# Patient Record
Sex: Male | Born: 1935 | Race: White | Hispanic: No | Marital: Married | Smoking: Never smoker
Health system: Southern US, Community
[De-identification: ages and names within clinical notes are randomized; demographics above are authoritative.]

## PROBLEM LIST (undated history)

## (undated) DIAGNOSIS — N200 Calculus of kidney: Secondary | ICD-10-CM

## (undated) DIAGNOSIS — C61 Malignant neoplasm of prostate: Secondary | ICD-10-CM

---

## 2005-09-28 ENCOUNTER — Ambulatory Visit: Payer: Self-pay | Admitting: Internal Medicine

## 2005-10-24 ENCOUNTER — Ambulatory Visit: Payer: Self-pay | Admitting: Internal Medicine

## 2005-11-01 ENCOUNTER — Ambulatory Visit: Payer: Self-pay | Admitting: Internal Medicine

## 2005-11-18 ENCOUNTER — Ambulatory Visit: Payer: Self-pay | Admitting: Internal Medicine

## 2006-02-21 ENCOUNTER — Ambulatory Visit: Payer: Self-pay | Admitting: Internal Medicine

## 2006-08-24 ENCOUNTER — Ambulatory Visit: Payer: Self-pay | Admitting: Internal Medicine

## 2007-01-13 ENCOUNTER — Other Ambulatory Visit: Payer: Self-pay

## 2007-01-13 ENCOUNTER — Emergency Department: Payer: Self-pay | Admitting: Emergency Medicine

## 2007-08-22 ENCOUNTER — Ambulatory Visit: Payer: Self-pay | Admitting: Gastroenterology

## 2009-04-07 ENCOUNTER — Ambulatory Visit: Payer: Self-pay | Admitting: Urology

## 2011-04-04 ENCOUNTER — Ambulatory Visit: Payer: Self-pay | Admitting: Internal Medicine

## 2011-04-06 ENCOUNTER — Ambulatory Visit: Payer: Self-pay | Admitting: Urology

## 2011-04-07 ENCOUNTER — Ambulatory Visit: Payer: Self-pay | Admitting: Urology

## 2011-06-07 ENCOUNTER — Ambulatory Visit: Payer: Self-pay | Admitting: Oncology

## 2012-12-26 ENCOUNTER — Ambulatory Visit: Payer: Self-pay | Admitting: Radiation Oncology

## 2013-01-06 ENCOUNTER — Ambulatory Visit: Payer: Self-pay | Admitting: Radiation Oncology

## 2013-01-24 LAB — CBC CANCER CENTER
Basophil #: 0 x10 3/mm (ref 0.0–0.1)
Basophil %: 0.7 %
Eosinophil #: 0.2 x10 3/mm (ref 0.0–0.7)
Eosinophil %: 3.8 %
HCT: 40 % (ref 40.0–52.0)
HGB: 13.7 g/dL (ref 13.0–18.0)
Lymphocyte #: 1.1 x10 3/mm (ref 1.0–3.6)
Lymphocyte %: 19.7 %
MCV: 90 fL (ref 80–100)
Monocyte #: 0.5 x10 3/mm (ref 0.2–1.0)
Monocyte %: 9.7 %
Neutrophil #: 3.6 x10 3/mm (ref 1.4–6.5)
Neutrophil %: 66.1 %
Platelet: 180 x10 3/mm (ref 150–440)
RDW: 13.5 % (ref 11.5–14.5)

## 2013-01-31 LAB — CBC CANCER CENTER
Basophil #: 0 x10 3/mm (ref 0.0–0.1)
Eosinophil #: 0.3 x10 3/mm (ref 0.0–0.7)
Eosinophil %: 5.9 %
HCT: 41.5 % (ref 40.0–52.0)
Lymphocyte #: 1 x10 3/mm (ref 1.0–3.6)
Lymphocyte %: 20.6 %
MCH: 30.9 pg (ref 26.0–34.0)
MCHC: 34.4 g/dL (ref 32.0–36.0)
Monocyte %: 10.7 %
Neutrophil #: 3.1 x10 3/mm (ref 1.4–6.5)
Platelet: 184 x10 3/mm (ref 150–440)
RBC: 4.63 10*6/uL (ref 4.40–5.90)

## 2013-02-05 ENCOUNTER — Ambulatory Visit: Payer: Self-pay | Admitting: Radiation Oncology

## 2013-02-07 LAB — CBC CANCER CENTER
Basophil #: 0 x10 3/mm (ref 0.0–0.1)
Eosinophil #: 0.3 x10 3/mm (ref 0.0–0.7)
Eosinophil %: 6.3 %
HCT: 40.2 % (ref 40.0–52.0)
Lymphocyte #: 0.9 x10 3/mm — ABNORMAL LOW (ref 1.0–3.6)
Lymphocyte %: 18.9 %
MCH: 30.9 pg (ref 26.0–34.0)
MCV: 90 fL (ref 80–100)
Monocyte %: 8.3 %
Neutrophil #: 3.1 x10 3/mm (ref 1.4–6.5)
Neutrophil %: 65.6 %
Platelet: 157 x10 3/mm (ref 150–440)
RBC: 4.44 10*6/uL (ref 4.40–5.90)
RDW: 13.4 % (ref 11.5–14.5)
WBC: 4.7 x10 3/mm (ref 3.8–10.6)

## 2013-02-14 LAB — CBC CANCER CENTER
Basophil #: 0 x10 3/mm (ref 0.0–0.1)
Basophil %: 0.8 %
Eosinophil #: 0.3 x10 3/mm (ref 0.0–0.7)
HGB: 13.4 g/dL (ref 13.0–18.0)
Lymphocyte #: 0.8 x10 3/mm — ABNORMAL LOW (ref 1.0–3.6)
Monocyte #: 0.4 x10 3/mm (ref 0.2–1.0)
Monocyte %: 9.1 %
Platelet: 166 x10 3/mm (ref 150–440)
RDW: 13.9 % (ref 11.5–14.5)

## 2013-02-21 LAB — CBC CANCER CENTER
Basophil #: 0 x10 3/mm (ref 0.0–0.1)
Basophil %: 0.9 %
HGB: 13.5 g/dL (ref 13.0–18.0)
Lymphocyte #: 0.8 x10 3/mm — ABNORMAL LOW (ref 1.0–3.6)
Lymphocyte %: 16.5 %
MCH: 30.3 pg (ref 26.0–34.0)
MCV: 90 fL (ref 80–100)
Monocyte %: 12 %
Neutrophil %: 65.2 %
RBC: 4.47 10*6/uL (ref 4.40–5.90)
RDW: 13.8 % (ref 11.5–14.5)
WBC: 5.1 x10 3/mm (ref 3.8–10.6)

## 2013-02-28 LAB — CBC CANCER CENTER
Basophil %: 0.7 %
Eosinophil #: 0.2 x10 3/mm (ref 0.0–0.7)
Eosinophil %: 5.6 %
HGB: 14.1 g/dL (ref 13.0–18.0)
Lymphocyte %: 18 %
MCH: 30.7 pg (ref 26.0–34.0)
Monocyte #: 0.4 x10 3/mm (ref 0.2–1.0)
Monocyte %: 8.3 %
Neutrophil #: 2.9 x10 3/mm (ref 1.4–6.5)
Neutrophil %: 67.4 %
RBC: 4.59 10*6/uL (ref 4.40–5.90)
WBC: 4.3 x10 3/mm (ref 3.8–10.6)

## 2013-03-08 ENCOUNTER — Ambulatory Visit: Payer: Self-pay | Admitting: Radiation Oncology

## 2013-04-08 ENCOUNTER — Ambulatory Visit: Payer: Self-pay | Admitting: Radiation Oncology

## 2013-08-19 ENCOUNTER — Ambulatory Visit: Payer: Self-pay | Admitting: Radiation Oncology

## 2013-08-20 LAB — PSA: PSA: 1.4 ng/mL (ref 0.0–4.0)

## 2013-09-08 ENCOUNTER — Ambulatory Visit: Payer: Self-pay | Admitting: Radiation Oncology

## 2014-02-18 ENCOUNTER — Ambulatory Visit: Payer: Self-pay | Admitting: Radiation Oncology

## 2014-02-20 LAB — PSA: PSA: 1.2 ng/mL (ref 0.0–4.0)

## 2014-03-08 ENCOUNTER — Ambulatory Visit: Payer: Self-pay | Admitting: Radiation Oncology

## 2014-08-19 ENCOUNTER — Ambulatory Visit: Payer: Self-pay | Admitting: Radiation Oncology

## 2014-08-21 LAB — PSA: PSA: 0.9 ng/mL (ref 0.0–4.0)

## 2014-09-08 ENCOUNTER — Ambulatory Visit: Payer: Self-pay | Admitting: Radiation Oncology

## 2014-11-28 NOTE — Consult Note (Signed)
Reason for Visit: This 81 year old Male patient presents to the clinic for initial evaluation of  prostate cancer .   Referred by Dr. Maryan Puls.  Diagnosis:  Chief Complaint/Diagnosis   81 year old male with stage II (T1 C. N0 M0) presenting with a Gleason 7 (3+4) and rising PSA despite Lupron therapy over the past 5 years.  Pathology Report pathology report reviewed   Referral Report clinical notes reviewed   Planned Treatment Regimen image guided radiation therapy   HPI   patient is a pleasant 81 year old male whose history dates back to 2009 when he presented with a rising PSA and underwent transrectal ultrasound-guided biopsy by Dr. Yves Dill  showing bi lobar aadenocarcinoma of the prostate Gleason score of 7 (3+4). He was started on intermittent Lupron therapy with PSA falling to 0.1 although started rising in September 2000 well. Most recent PSA was 3.1. Patient has very little urologic symptoms no specific urgency frequency or nocturia. He tends towards constipation. I been asked to evaluate the patient for consideration of radiation therapy at this time despite his advanced age she is in excellent general condition and overall good health continues to play golf on a regular basis and walk the course.  Past Hx:    Sleep Apnea:    Hx of Kidney Stones:    Hx of Skin Cancer:    Prostate Cancer:    sleep apnea:   Past, Family and Social History:  Past Medical History positive   Respiratory sleep apnea   Genitourinary kidney stones; sstatus post lithotripsy   Past Surgical History history of skin cancer   Family History positive   Family History Comments family history of thyroid cancer, breast cancer and anemia   Social History noncontributory   Additional Past Medical and Surgical History seen by himself today   Allergies:   No Known Allergies:   Home Meds:  Home Medications: Medication Instructions Status  Levaquin 500 mg oral tablet 1 tab(s) orally once a  day x 7 days. Begin medication two days before Dr. Letta Kocher appointment. Begin taking medication on  12/31/2012.  Active  aspirin 81 mg oral delayed release tablet 1 tab(s) orally once a day Active  Prosteon Calcium with Magnesium, Vitamins D and K oral tablet 2 tab(s) orally 2 times a day Active  Companion Multi Multiple Vitamins with Minerals oral tablet 1 tab(s) orally once a day Active  Oxygen 3 liter(s) inhaled once a day (at bedtime) Active   Review of Systems:  General negative   Performance Status (ECOG) 0   Skin negative   Breast negative   Ophthalmologic negative   ENMT negative   Respiratory and Thorax negative   Cardiovascular negative   Gastrointestinal negative   Genitourinary negative   Musculoskeletal negative   Neurological negative   Psychiatric negative   Hematology/Lymphatics negative   Endocrine negative   Allergic/Immunologic negative   Review of Systems   according to the nurse's notes Patient denies any weight loss, fatigue, weakness, fever, chills or night sweats. Patient denies any loss of vision, blurred vision. Patient denies any ringing  of the ears or hearing loss. No irregular heartbeat. Patient denies heart murmur or history of fainting. Patient denies any chest pain or pain radiating to her upper extremities. Patient denies any shortness of breath, difficulty breathing at night, cough or hemoptysis. Patient denies any swelling in the lower legs. Patient denies any nausea vomiting, vomiting of blood, or coffee ground material in the vomitus. Patient denies any stomach  pain. Patient states has had normal bowel movements no significant constipation or diarrhea. Patient denies any dysuria, hematuria or significant nocturia. Patient denies any problems walking, swelling in the joints or loss of balance. Patient denies any skin changes, loss of hair or loss of weight. Patient denies any excessive worrying or anxiety or significant depression. Patient  denies any problems with insomnia. Patient denies excessive thirst, polyuria, polydipsia. Patient denies any swollen glands, patient denies easy bruising or easy bleeding. Patient denies any recent infections, allergies or URI. Patient "s visual fields have not changed significantly in recent time.  Nursing Notes:  Nursing Vital Signs and Chemo Nursing Nursing Notes: *CC Vital Signs Flowsheet:   21-May-14 13:33  Temp Temperature 96.3  Pulse Pulse 60  Respirations Respirations 18  SBP SBP 129  DBP DBP 77  Pain Scale (0-10)  0  Current Weight (kg) (kg) 60  Height (cm) centimeters 169.7  BSA (m2) 1.6   Physical Exam:  General/Skin/HEENT:  General normal   Skin normal   Eyes normal   ENMT normal   Head and Neck normal   Additional PE well-developed thin male in NAD. Lungs are clear to A&P cardiac examination shows regular rate and rhythm. On rectal exam rectal sphincter tone is good prostate is smooth without evidence of nodularity or mass. Sulcus is preserved bilaterally seminal vesicle region appears within normal limits.   Breasts/Resp/CV/GI/GU:  Respiratory and Thorax normal   Cardiovascular normal   Gastrointestinal normal   Genitourinary normal   MS/Neuro/Psych/Lymph:  Musculoskeletal normal   Neurological normal   Lymphatics normal   Other Results:  Radiology Results: CT:    27-Aug-12 16:41, CT Abdomen Pelvis WO for Stone  CT Abdomen Pelvis WO for Stone   REASON FOR EXAM:    CR  [id number]   sharp pain in kidney area  eval for   kidney stones  COMMENTS:       PROCEDURE: CT  - CT ABDOMEN /PELVIS WO (STONE)  - Apr 04 2011  4:41PM     RESULT: History: Pain.    Findings: Standard nonenhanced CT obtained. Hepatic cysts are present.   Spleen normal. Pancreas normal. Right kidney normal. 33mm stone in the   proximal left ureter with left hydronephrosis. Left nephrolithiasis.   Parapelvic cysts may be present. Bladder nondistended. Left inguinal   hernia  withherniation of bowel. It appears that the colon and possible   small bowel is herniated. There is mild distention of the colon. Partial   obstruction or adynamic ileus cannot be excluded. The cecum measures 5.6     cm in diameter. Appendix unremarkable. No free fluid. No free air. Lung   bases clear.    IMPRESSION:     1. 5 mm stone the proximal left ureter. There may be a smaller adjacent   stone. There is left hydronephrosis. Stones are noted in   the left renal collecting system. Left parapelvic cysts may be present.  2. Left inguinal hernia. Report phoned to patient's physician at time of   study.          Verified By: Osa Craver, M.D., MD   Relevent Results:   Relevant Scans and Labs CT scan is reviewed   Assessment and Plan: Impression:   stage II adenocarcinoma prostate in 81 year old male with rising PSA despite Lupron hormonal intervention Plan:   at this time is obvious is  fossa cancers become hormone refractory as witnessed by rising PSA despite Lupron therapy. I believe his  overall general condition is excellent for his age and #1 treatment option at 81 years old would be external beam IMRT radiation should treatments with image guided technique. I've asked Dr. Yves Dill to place gold fiducial markers for daily image guided treatment.Would plan on delivering 8000 cGy over 8 weeks to the prostate and proximal seminal vesicles. Risks and benefits of treatment including increased frequency urgency and possible nocturia, alteration of blood counts, possible diarrhea, explained in detail to the patient. He seems to comprehend my treatment plan well and has except a treatment. Plans were made for gold marker placement as well as followup CT simulation.  I would like to take this opportunity to thank you for allowing me to continue to participate in this patient's care.  CC Referral:  cc: Dr. Frazier Richards   Electronic Signatures: Baruch Gouty, Roda Shutters (MD)  (Signed  21-May-14 14:21)  Authored: HPI, Diagnosis, Past Hx, PFSH, Allergies, Home Meds, ROS, Nursing Notes, Physical Exam, Other Results, Relevent Results, Encounter Assessment and Plan, CC Referring Physician   Last Updated: 21-May-14 14:21 by [name] (MD)

## 2015-04-01 ENCOUNTER — Other Ambulatory Visit: Payer: Self-pay | Admitting: Urology

## 2015-04-01 DIAGNOSIS — R319 Hematuria, unspecified: Secondary | ICD-10-CM

## 2015-04-03 ENCOUNTER — Inpatient Hospital Stay: Payer: Medicare Other | Admitting: Anesthesiology

## 2015-04-03 ENCOUNTER — Ambulatory Visit: Admission: RE | Admit: 2015-04-03 | Payer: Medicare Other | Source: Ambulatory Visit | Admitting: Urology

## 2015-04-03 ENCOUNTER — Inpatient Hospital Stay: Payer: Medicare Other

## 2015-04-03 ENCOUNTER — Encounter: Payer: Self-pay | Admitting: *Deleted

## 2015-04-03 ENCOUNTER — Ambulatory Visit
Admission: RE | Admit: 2015-04-03 | Discharge: 2015-04-03 | Disposition: A | Discharge status: Home / Self Care | Payer: Medicare Other | Source: Ambulatory Visit | Attending: Urology | Admitting: Urology

## 2015-04-03 ENCOUNTER — Observation Stay
Admission: AD | Admit: 2015-04-03 | Discharge: 2015-04-04 | Disposition: A | Discharge status: Home / Self Care | Payer: Medicare Other | Source: Ambulatory Visit | Attending: Urology | Admitting: Urology

## 2015-04-03 ENCOUNTER — Encounter
Admission: AD | Disposition: A | Discharge status: Home / Self Care | Payer: Self-pay | Source: Ambulatory Visit | Attending: Urology

## 2015-04-03 DIAGNOSIS — Z7982 Long term (current) use of aspirin: Secondary | ICD-10-CM | POA: Insufficient documentation

## 2015-04-03 DIAGNOSIS — N200 Calculus of kidney: Secondary | ICD-10-CM | POA: Diagnosis not present

## 2015-04-03 DIAGNOSIS — M810 Age-related osteoporosis without current pathological fracture: Secondary | ICD-10-CM | POA: Diagnosis not present

## 2015-04-03 DIAGNOSIS — N3041 Irradiation cystitis with hematuria: Secondary | ICD-10-CM | POA: Diagnosis not present

## 2015-04-03 DIAGNOSIS — R319 Hematuria, unspecified: Secondary | ICD-10-CM | POA: Diagnosis present

## 2015-04-03 DIAGNOSIS — R310 Gross hematuria: Secondary | ICD-10-CM | POA: Diagnosis present

## 2015-04-03 DIAGNOSIS — Z8546 Personal history of malignant neoplasm of prostate: Secondary | ICD-10-CM | POA: Diagnosis not present

## 2015-04-03 DIAGNOSIS — R338 Other retention of urine: Secondary | ICD-10-CM

## 2015-04-03 DIAGNOSIS — J479 Bronchiectasis, uncomplicated: Secondary | ICD-10-CM | POA: Diagnosis not present

## 2015-04-03 DIAGNOSIS — K862 Cyst of pancreas: Secondary | ICD-10-CM | POA: Diagnosis not present

## 2015-04-03 DIAGNOSIS — I77819 Aortic ectasia, unspecified site: Secondary | ICD-10-CM | POA: Diagnosis not present

## 2015-04-03 DIAGNOSIS — N3289 Other specified disorders of bladder: Secondary | ICD-10-CM | POA: Insufficient documentation

## 2015-04-03 DIAGNOSIS — K6389 Other specified diseases of intestine: Secondary | ICD-10-CM | POA: Insufficient documentation

## 2015-04-03 DIAGNOSIS — Z87442 Personal history of urinary calculi: Secondary | ICD-10-CM | POA: Insufficient documentation

## 2015-04-03 DIAGNOSIS — Z79899 Other long term (current) drug therapy: Secondary | ICD-10-CM | POA: Diagnosis not present

## 2015-04-03 DIAGNOSIS — K4090 Unilateral inguinal hernia, without obstruction or gangrene, not specified as recurrent: Secondary | ICD-10-CM | POA: Diagnosis not present

## 2015-04-03 DIAGNOSIS — N281 Cyst of kidney, acquired: Secondary | ICD-10-CM | POA: Insufficient documentation

## 2015-04-03 DIAGNOSIS — Z9889 Other specified postprocedural states: Secondary | ICD-10-CM | POA: Insufficient documentation

## 2015-04-03 DIAGNOSIS — I517 Cardiomegaly: Secondary | ICD-10-CM | POA: Diagnosis not present

## 2015-04-03 DIAGNOSIS — Z01818 Encounter for other preprocedural examination: Secondary | ICD-10-CM

## 2015-04-03 HISTORY — PX: CYSTOSCOPY WITH BIOPSY: SHX5122

## 2015-04-03 HISTORY — DX: Calculus of kidney: N20.0

## 2015-04-03 HISTORY — DX: Malignant neoplasm of prostate: C61

## 2015-04-03 LAB — CBC
HCT: 26.4 % — ABNORMAL LOW (ref 40.0–52.0)
HEMOGLOBIN: 8.8 g/dL — AB (ref 13.0–18.0)
MCH: 30.1 pg (ref 26.0–34.0)
MCHC: 33.3 g/dL (ref 32.0–36.0)
MCV: 90.3 fL (ref 80.0–100.0)
PLATELETS: 155 10*3/uL (ref 150–440)
RBC: 2.92 MIL/uL — AB (ref 4.40–5.90)
RDW: 14.1 % (ref 11.5–14.5)
WBC: 4.9 10*3/uL (ref 3.8–10.6)

## 2015-04-03 LAB — BASIC METABOLIC PANEL
ANION GAP: 6 (ref 5–15)
BUN: 22 mg/dL — ABNORMAL HIGH (ref 6–20)
CALCIUM: 8.5 mg/dL — AB (ref 8.9–10.3)
CHLORIDE: 104 mmol/L (ref 101–111)
CO2: 25 mmol/L (ref 22–32)
CREATININE: 1.41 mg/dL — AB (ref 0.61–1.24)
GFR calc Af Amer: 48 mL/min — ABNORMAL LOW (ref >60)
GFR calc non Af Amer: 41 mL/min — ABNORMAL LOW (ref >60)
Glucose, Bld: 104 mg/dL — ABNORMAL HIGH (ref 65–99)
Potassium: 4.3 mmol/L (ref 3.5–5.1)
SODIUM: 135 mmol/L (ref 135–145)

## 2015-04-03 LAB — ABO/RH: ABO/RH(D): O POS

## 2015-04-03 LAB — PREPARE RBC (CROSSMATCH)

## 2015-04-03 SURGERY — CYSTOSCOPY, WITH BIOPSY
Anesthesia: General | Wound class: Clean Contaminated

## 2015-04-03 MED ORDER — LIDOCAINE HCL 2 % EX GEL
CUTANEOUS | Status: AC
  Filled 2015-04-03: qty 10

## 2015-04-03 MED ORDER — OXYBUTYNIN CHLORIDE 5 MG PO TABS
5.0000 mg | ORAL_TABLET | Freq: Three times a day (TID) | ORAL | Status: DC | PRN
  Administered 2015-04-04: 5 mg via ORAL
  Filled 2015-04-03 (×2): qty 1

## 2015-04-03 MED ORDER — BELLADONNA ALKALOIDS-OPIUM 16.2-60 MG RE SUPP
RECTAL | Status: AC
Start: 2015-04-03 — End: 2015-04-03
  Filled 2015-04-03: qty 1

## 2015-04-03 MED ORDER — DOCUSATE SODIUM 100 MG PO CAPS
100.0000 mg | ORAL_CAPSULE | Freq: Two times a day (BID) | ORAL | Status: DC
  Administered 2015-04-03 – 2015-04-04 (×2): 100 mg via ORAL
  Filled 2015-04-03 (×2): qty 1

## 2015-04-03 MED ORDER — LIDOCAINE HCL 2 % EX GEL
CUTANEOUS | Status: DC | PRN
  Administered 2015-04-03: 1

## 2015-04-03 MED ORDER — LEVOFLOXACIN IN D5W 500 MG/100ML IV SOLN
INTRAVENOUS | Status: AC
  Administered 2015-04-03: 500 mg via INTRAVENOUS
  Filled 2015-04-03: qty 100

## 2015-04-03 MED ORDER — SODIUM CHLORIDE 0.9 % IV SOLN
Freq: Once | INTRAVENOUS | Status: AC
  Administered 2015-04-03: 13:00:00 via INTRAVENOUS

## 2015-04-03 MED ORDER — FENTANYL CITRATE (PF) 100 MCG/2ML IJ SOLN
25.0000 ug | INTRAMUSCULAR | Status: DC | PRN
Start: 2015-04-03 — End: 2015-04-04

## 2015-04-03 MED ORDER — LEVOFLOXACIN 500 MG PO TABS
500.0000 mg | ORAL_TABLET | Freq: Every day | ORAL | Status: DC
  Administered 2015-04-04: 500 mg via ORAL
  Filled 2015-04-03: qty 1

## 2015-04-03 MED ORDER — BELLADONNA ALKALOIDS-OPIUM 16.2-60 MG RE SUPP
RECTAL | Status: DC | PRN
  Administered 2015-04-03: 1 via RECTAL

## 2015-04-03 MED ORDER — ONDANSETRON HCL 4 MG/2ML IJ SOLN: 4.0000 mg | Freq: Once | INTRAMUSCULAR | Status: DC | PRN

## 2015-04-03 MED ORDER — ONDANSETRON HCL 4 MG/2ML IJ SOLN
INTRAMUSCULAR | Status: DC | PRN
  Administered 2015-04-03: 4 mg via INTRAVENOUS

## 2015-04-03 MED ORDER — ONDANSETRON HCL 4 MG/2ML IJ SOLN: 4.0000 mg | INTRAMUSCULAR | Status: DC | PRN

## 2015-04-03 MED ORDER — LEVOFLOXACIN IN D5W 500 MG/100ML IV SOLN
500.0000 mg | INTRAVENOUS | Status: AC
  Administered 2015-04-03: 500 mg via INTRAVENOUS
  Filled 2015-04-03: qty 100

## 2015-04-03 MED ORDER — PROPOFOL 10 MG/ML IV BOLUS
INTRAVENOUS | Status: DC | PRN
  Administered 2015-04-03: 100 mg via INTRAVENOUS

## 2015-04-03 MED ORDER — LIDOCAINE HCL (CARDIAC) 20 MG/ML IV SOLN
INTRAVENOUS | Status: DC | PRN
  Administered 2015-04-03: 60 mg via INTRAVENOUS

## 2015-04-03 MED ORDER — DEXTROSE-NACL 5-0.45 % IV SOLN
INTRAVENOUS | Status: DC
  Administered 2015-04-04 (×2): via INTRAVENOUS

## 2015-04-03 MED ORDER — FENTANYL CITRATE (PF) 100 MCG/2ML IJ SOLN
INTRAMUSCULAR | Status: DC | PRN
  Administered 2015-04-03: 50 ug via INTRAVENOUS

## 2015-04-03 MED ORDER — EPHEDRINE SULFATE 50 MG/ML IJ SOLN
INTRAMUSCULAR | Status: DC | PRN
  Administered 2015-04-03: 5 mg via INTRAVENOUS

## 2015-04-03 MED ORDER — OXYCODONE-ACETAMINOPHEN 5-325 MG PO TABS
1.0000 | ORAL_TABLET | ORAL | Status: DC | PRN
  Administered 2015-04-04: 1 via ORAL
  Filled 2015-04-03: qty 1

## 2015-04-03 SURGICAL SUPPLY — 22 items
BAG DRAIN CYSTO-URO LG1000N (MISCELLANEOUS) ×3 IMPLANT
CATH FOLEY 3WAY 30CC 24FR (CATHETERS) ×2
CATH URTH STD 24FR FL 3W 2 (CATHETERS) ×1 IMPLANT
DRESSING TELFA 4X3 1S ST N-ADH (GAUZE/BANDAGES/DRESSINGS) ×3 IMPLANT
ELECT RESECT POWERBALL 24F (MISCELLANEOUS) ×3 IMPLANT
GLOVE BIO SURGEON STRL SZ7 (GLOVE) ×3 IMPLANT
GLOVE BIO SURGEON STRL SZ7.5 (GLOVE) ×3 IMPLANT
GOWN STRL REUS W/ TWL LRG LVL3 (GOWN DISPOSABLE) ×1 IMPLANT
GOWN STRL REUS W/ TWL XL LVL3 (GOWN DISPOSABLE) ×1 IMPLANT
GOWN STRL REUS W/TWL LRG LVL3 (GOWN DISPOSABLE) ×2
GOWN STRL REUS W/TWL XL LVL3 (GOWN DISPOSABLE) ×2
KIT RM TURNOVER CYSTO AR (KITS) ×3 IMPLANT
NDL SAFETY 22GX1.5 (NEEDLE) ×3 IMPLANT
PACK CYSTO AR (MISCELLANEOUS) ×3 IMPLANT
PAD GROUND ADULT SPLIT (MISCELLANEOUS) ×3 IMPLANT
PREP PVP WINGED SPONGE (MISCELLANEOUS) ×3 IMPLANT
SET IRRIG Y TYPE TUR BLADDER L (SET/KITS/TRAYS/PACK) ×3 IMPLANT
SOL PREP PVP 2OZ (MISCELLANEOUS) ×3
SOLUTION PREP PVP 2OZ (MISCELLANEOUS) ×1 IMPLANT
SYRINGE IRR TOOMEY STRL 70CC (SYRINGE) ×3 IMPLANT
WATER STERILE IRR 1000ML POUR (IV SOLUTION) ×3 IMPLANT
WATER STERILE IRR 3000ML UROMA (IV SOLUTION) ×3 IMPLANT

## 2015-04-03 NOTE — Anesthesia Postprocedure Evaluation (Signed)
  Anesthesia Post-op Note  Patient: [name]  Procedure(s) Performed: Procedure(s): CYSTOSCOPY WITH BIOPSY/FULGERATION (N/A)  Anesthesia type:General  Patient location: PACU  Post pain: Pain level controlled  Post assessment: Post-op Vital signs reviewed, Patient's Cardiovascular Status Stable, Respiratory Function Stable, Patent Airway and No signs of Nausea or vomiting  Post vital signs: Reviewed and stable  Last Vitals:  Filed Vitals:   04/03/15 1001  BP: 110/72  Pulse: 91  Temp: 36.4 C  Resp: 18    Level of consciousness: awake, alert  and patient cooperative  Complications: No apparent anesthesia complications

## 2015-04-03 NOTE — Transfer of Care (Signed)
Immediate Anesthesia Transfer of Care Note  Patient: [name]  Procedure(s) Performed: Procedure(s): CYSTOSCOPY WITH BIOPSY/FULGERATION (N/A)  Patient Location: PACU  Anesthesia Type:General  Level of Consciousness: awake  Airway & Oxygen Therapy: Patient Spontanous Breathing  Post-op Assessment: Report given to RN  Post vital signs: stable  Last Vitals:  Filed Vitals:   04/03/15 1001  BP: 110/72  Pulse: 91  Temp: 36.4 C  Resp: 18    Complications: No apparent anesthesia complications

## 2015-04-03 NOTE — Anesthesia Procedure Notes (Signed)
Procedure Name: LMA Insertion Date/Time: 04/03/2015 1:36 PM Performed by: Zetta Bills Pre-anesthesia Checklist: Patient identified, Emergency Drugs available, Suction available and Patient being monitored Patient Re-evaluated:Patient Re-evaluated prior to inductionOxygen Delivery Method: Circle system utilized Preoxygenation: Pre-oxygenation with 100% oxygen Intubation Type: IV induction Ventilation: Mask ventilation without difficulty LMA: LMA inserted LMA Size: 5.0 Number of attempts: 1 Tube secured with: Tape Dental Injury: Teeth and Oropharynx as per pre-operative assessment

## 2015-04-03 NOTE — H&P (Signed)
Date of Initial H&P: 04/03/15  History reviewed, patient examined, no change in status, stable for surgery.

## 2015-04-03 NOTE — Op Note (Signed)
Preoperative diagnosis: 1. Gross hematuria                                             2. Clot urinary retention                                             3. Hemorrhagic radiation cystitis  Postoperative diagnosis: Same   Procedure: 1. Cystoscopy with fulguration of bladder                      2. Clot irrigation and evacuation   Surgeon: Otelia Limes. Yves Dill MD, FACS Anesthesia: Gen.  Indications:See the history and physical. After informed consent the above procedure(s) were requested     Technique and findings: After adequate general anesthesia had been obtained patient was placed into dorsal lithotomy position and the perineum was prepped and draped in the usual fashion. The 24 French resectoscope sheath was advanced into the bladder with the obturator in place. The resectoscope was coupled to the camera and placed into the sheath. Patient had numerous clots in the bladder. Using a Toomey syringe the clots were evacuated. The resectoscope was then placed back into the sheath and bladder was inspected. Bladder was heavily trabeculated. Patient had a single latter diverticulum posteriorly. No bladder tumors were identified. Both ureteral orifices were identified and had clear reflux. Patient had TURP defect. Patient had 4 areas of hemorrhage involving the lateral and posterior walls of the bladder. Appearance was consistent with radiation cystitis. At this point no further bleeders were identified. The resected scope was then removed. 10 cc of viscous Xylocaine was instilled within the urethra and the bladder. After had clear drainage. A B&O suppository was placed. The procedure was then terminated and the patient transferred to the recovery room in stable condition.

## 2015-04-03 NOTE — Anesthesia Preprocedure Evaluation (Signed)
Anesthesia Evaluation  Patient identified by MRN, date of birth, ID band Patient awake    Reviewed: Allergy & Precautions, Patient's Chart, lab work & pertinent test results  Airway Mallampati: III       Dental  (+) Partial Lower   Pulmonary neg pulmonary ROS,    Pulmonary exam normal       Cardiovascular negative cardio ROS Normal cardiovascular exam    Neuro/Psych negative neurological ROS     GI/Hepatic negative GI ROS, Neg liver ROS,   Endo/Other  negative endocrine ROS  Renal/GU Renal diseasenegative Renal ROS     Musculoskeletal negative musculoskeletal ROS (+)   Abdominal Normal abdominal exam  (+)   Peds negative pediatric ROS (+)  Hematology negative hematology ROS (+)   Anesthesia Other Findings   Reproductive/Obstetrics                             Anesthesia Physical Anesthesia Plan  ASA: III  Anesthesia Plan: General   Post-op Pain Management:    Induction: Intravenous  Airway Management Planned: LMA  Additional Equipment:   Intra-op Plan:   Post-operative Plan: Extubation in OR  Informed Consent: I have reviewed the patients History and Physical, chart, labs and discussed the procedure including the risks, benefits and alternatives for the proposed anesthesia with the patient or authorized representative who has indicated his/her understanding and acceptance.     Plan Discussed with: Anesthesiologist  Anesthesia Plan Comments:         Anesthesia Quick Evaluation

## 2015-04-04 DIAGNOSIS — N3041 Irradiation cystitis with hematuria: Secondary | ICD-10-CM | POA: Diagnosis not present

## 2015-04-04 DIAGNOSIS — R310 Gross hematuria: Secondary | ICD-10-CM | POA: Diagnosis present

## 2015-04-04 LAB — HEMOGLOBIN AND HEMATOCRIT, BLOOD
HEMATOCRIT: 30 % — AB (ref 40.0–52.0)
Hemoglobin: 10.2 g/dL — ABNORMAL LOW (ref 13.0–18.0)

## 2015-04-04 MED ORDER — DOCUSATE SODIUM 100 MG PO CAPS: 100.0000 mg | ORAL_CAPSULE | Freq: Two times a day (BID) | ORAL | Status: AC

## 2015-04-04 MED ORDER — MAGNESIUM CITRATE PO SOLN: 1.0000 | Freq: Once | ORAL | Status: AC

## 2015-04-04 MED ORDER — OXYCODONE-ACETAMINOPHEN 5-325 MG PO TABS: 1.0000 | ORAL_TABLET | ORAL | Status: AC | PRN

## 2015-04-04 MED ORDER — MAGNESIUM CITRATE PO SOLN
1.0000 | Freq: Once | ORAL | Status: AC
  Administered 2015-04-04: 1 via ORAL
  Filled 2015-04-04: qty 296

## 2015-04-04 NOTE — Progress Notes (Addendum)
[name] is a 82 y.o. male patient. 1. Preop testing   2. Gross hematuria   3. Clot retention of urine    Past Medical History  Diagnosis Date  . Kidney stone   . Prostate cancer    Current Facility-Administered Medications  Medication Dose Route Frequency Provider Last Rate Last Dose  . dextrose 5 %-0.45 % sodium chloride infusion   Intravenous Continuous Royston Cowper, MD 150 mL/hr at 04/04/15 [id number]    . docusate sodium (COLACE) capsule 100 mg  100 mg Oral BID Royston Cowper, MD   100 mg at 04/03/15 2132  . fentaNYL (SUBLIMAZE) injection 25 mcg  25 mcg Intravenous Q5 min PRN Gijsbertus F Boston Service, MD      . levofloxacin [facility]) tablet 500 mg  500 mg Oral Daily Royston Cowper, MD      . ondansetron [facility]) injection 4 mg  4 mg Intravenous Once PRN Gijsbertus Lonia Mad, MD      . ondansetron [facility]) injection 4 mg  4 mg Intravenous Q4H PRN Royston Cowper, MD      . oxybutynin [facility]) tablet 5 mg  5 mg Oral Q8H PRN Royston Cowper, MD   5 mg at 04/04/15 0522  . oxyCODONE-acetaminophen (PERCOCET/ROXICET) 5-325 MG per tablet 1-2 tablet  1-2 tablet Oral Q4H PRN Royston Cowper, MD   1 tablet at 04/04/15 0522   No Known Allergies Active Problems:   Clot hematuria  Blood pressure 101/58, pulse 54, temperature 98.2 F (36.8 C), temperature source Oral, resp. rate 16, height 5\' 6"  (1.676 m), weight 54.885 kg (121 lb), SpO2 98 %.  Subjective : Tolerating liquid diet. Occasional bladder spasms.  Objective: Urine output clear. Hematocrit 30%. Vital signs stable. CT findings consistent with radiation cystitis and radiation proctitis. Kidney stones noted.  Assessment & Plan: Regular diet. Foley removed. Home later today.  Parrish Daddario R 04/04/2015

## 2015-04-04 NOTE — Progress Notes (Signed)
Initial Nutrition Assessment  DOCUMENTATION CODES:   Severe malnutrition in context of chronic illness  INTERVENTION:  Meals and snacks: Cater to pt preferences Nutrition Supplement Therapy: Recommend Ensure Enlive po BID, each supplement provides 350 kcal and 20 grams of protein    NUTRITION DIAGNOSIS:   Increased nutrient needs related to cancer and cancer related treatments as evidenced by moderate depletion of body fat, severe depletion of body fat, severe depletion of muscle mass, moderate depletions of muscle mass.    GOAL:   Patient will meet greater than or equal to 90% of their needs    MONITOR:    (Energy intake)  REASON FOR ASSESSMENT:   Malnutrition Screening Tool    ASSESSMENT:      Pt s/p cystoscopy with fulguration of bladder, clot irrigation and evacuation  Past Medical History  Diagnosis Date  . Kidney stone   . Prostate cancer     Current Nutrition: NPO, diet just progressed to solid foods this am, no tray received yet  Food/Nutrition-Related History: Pt reports good appetite prior to admission, eating 3 meals per day   Medications: D5 1/2 NS at 188ml/hr, colace  Electrolyte/Renal Profile and Glucose Profile:   Recent Labs Lab 04/03/15 1138  NA 135  K 4.3  CL 104  CO2 25  BUN 22*  CREATININE 1.41*  CALCIUM 8.5*  GLUCOSE 104*    Gastrointestinal Profile: Last BM:8/24   Nutrition-Focused Physical Exam Findings: Nutrition-Focused physical exam completed. Findings are moderate to severe fat depletion, moderate to severe muscle depletion, and none edema.      Weight Change: 14% weight loss in the last year per pt    Diet Order:  Diet regular Room service appropriate?: Yes; Fluid consistency:: Thin Diet - low sodium heart healthy  Skin:   reviewed   Height:   Ht Readings from Last 1 Encounters:  04/03/15 5\' 6"  (1.676 m)    Weight:   Wt Readings from Last 1 Encounters:  04/03/15 121 lb (54.885 kg)     BMI:   Body mass index is 19.54 kg/(m^2).  Estimated Nutritional Needs:   Kcal:  BEE 1137 kcals (IF 1.0-1.2, AF 1.3) 0737-1062 kcals/d.  Protein:  (1.2-1.5 g/kg) 66-83 g/d  Fluid:  (30-23ml/kg) 1650-1925 ml/d  EDUCATION NEEDS:   No education needs identified at this time  HIGH Care Level  [name] B. Zenia Resides, [place], Brent (pager)

## 2015-04-04 NOTE — Clinical Social Work Note (Signed)
Clinical Social Work Assessment  Patient Details  Name: [name] MRN: [id number] Date of Birth: 06/15/1920  Date of referral:  04/04/15               Reason for consult:  Other (Comment Required) (concern for taking patient home after outpatient surgery)                Permission sought to share information with:  Family Supports, Chartered certified accountant granted to share information::  Yes, Verbal Permission Granted  Name::        Agency::     Relationship::     Contact Information:     Housing/Transportation Living arrangements for the past 2 months:   (home) Source of Information:  Patient, Adult Children, Spouse Patient Interpreter Needed:    Criminal Activity/Legal Involvement Pertinent to Current Situation/Hospitalization:  No - Comment as needed Significant Relationships:  Adult Children, Spouse Lives with:  Spouse Do you feel safe going back to the place where you live?  Yes Need for family participation in patient care:  Yes (Comment)  Care giving concerns:  Patient is an elderly 81 year old who lives with his wife who is also in her 43's.    Social Worker assessment / plan:  CSW asked to meet with patient and his wife and daughter in the room due to them having concern about taking patient home one day after outpatient surgery. CSW met with patient, patient's wife and daughter( who is allegedly an Therapist, sports) and patient's wife explained that she is unsure if she can take care of patient and did not expect him to be weak after surgery and did not expect him to discharge the next day after surgery. CSW explained the options: paying privately for SNF and that on the weekend the three SNF's that will accept admissions and private pay are: [facility], [facility], and [facility], home with home health, home with sitters services, or home with both home health and sitter services. Patient's daughter stated that she and her husband would be staying with her parents through  the weekend and that patient's other daughter could come up Tuesday. Patient's wife verbalized that she felt much better after speaking with this CSW and that she would take patient home today with sitter list. I provided the sitter list for her. She has declined home health at this time. CSW has provided patient's wife with the [facility] main number and admission's coordinator name as this was the facility that she does prefer and states she may contact them should Monday come and she does not believe it is working out at home. She is aware patient would still be private pay and she states that they can afford this if needed. CSW will do an FL2 and pasrr and forward to [facility] so that they will have it in the event patient's wife calls them at the beginning of the week.  Employment status:  Retired Forensic scientist:  Medicare PT Recommendations:  Not assessed at this time Information / Referral to community resources:  [place]  Patient/Family's Response to care:  Patient and daughter expressed gratitude for CSW assistance.  Patient/Family's Understanding of and Emotional Response to Diagnosis, Current Treatment, and Prognosis:  Patient and family verbalized understanding of options and wish to proceed with returning home today.  Emotional Assessment Appearance:  Appears younger than stated age Attitude/Demeanor/Rapport:   (pleasant and cooperative) Affect (typically observed):  Accepting, Adaptable, Appropriate Orientation:  Oriented to Self,  Oriented to Place, Oriented to  Time, Oriented to Situation Alcohol / Substance use:  Not Applicable Psych involvement (Current and /or in the community):  No (Comment)  Discharge Needs  Concerns to be addressed:  Care Coordination Readmission within the last 30 days:  No Current discharge risk:  None Barriers to Discharge:  No Barriers Identified   Shela Leff, LCSW 04/04/2015, 1:01 PM

## 2015-04-04 NOTE — Progress Notes (Signed)
Pt A and O x 4. VSS. Pt tolerating diet well. Pt up to chair and amble to ambulate to restroom on his own. No complaints of pain or nausea. IV removed intact, prescriptions given. Pt voiced understanding of discharge instructions with no further questions. Patients' family given information on sitters available in case pt may need one. Pt discharged via wheelchair with nurse aide.

## 2015-04-04 NOTE — Discharge Instructions (Signed)

## 2015-04-05 LAB — TYPE AND SCREEN
ABO/RH(D): O POS
Antibody Screen: NEGATIVE
UNIT DIVISION: 0
UNIT DIVISION: 0
Unit division: 0
Unit division: 0

## 2015-04-05 LAB — URINE CULTURE: CULTURE: NO GROWTH

## 2015-04-06 ENCOUNTER — Encounter: Payer: Self-pay | Admitting: Urology

## 2015-04-21 ENCOUNTER — Other Ambulatory Visit: Payer: Self-pay | Admitting: Urology

## 2015-04-21 DIAGNOSIS — R310 Gross hematuria: Secondary | ICD-10-CM

## 2015-04-23 ENCOUNTER — Encounter: Payer: Medicare Other | Attending: Surgery | Admitting: Surgery

## 2015-04-23 DIAGNOSIS — G4730 Sleep apnea, unspecified: Secondary | ICD-10-CM | POA: Diagnosis not present

## 2015-04-23 DIAGNOSIS — N3041 Irradiation cystitis with hematuria: Secondary | ICD-10-CM | POA: Diagnosis present

## 2015-04-23 DIAGNOSIS — D075 Carcinoma in situ of prostate: Secondary | ICD-10-CM | POA: Diagnosis not present

## 2015-04-23 NOTE — Progress Notes (Signed)
ARPAN, ESKELSON ([id number]) Visit Report for 04/23/2015 HBO Risk Assessment Details Patient Name: [name], [name]. Date of Service: 04/23/2015 8:00 AM Medical Record Number: [id number] Patient Account Number: [id number] Date of Birth/Sex: Jan 14, 1920 (81 y.o. Male) Treating RN: [name] Primary Care Physician: [name] Other Clinician: Referring Physician: Maryan [name] Treating Physician/Extender: [name] in Treatment: 0 HBO Risk Assessment Items Answer Barotrauma Risks: Upper Respiratory Infections No Prior Radiation Treatment to Head/Neck No Tracheostomy No Ear problems or surgery (otosclerosis)- Consider pressure equalization tubes Yes Sinus Problems, Sinus Obstruction No Pulmonary Risks: Currently seeing a pulmonologisto No Emphysema No Pneumothorax No Tuberculosis No Other lung problems (COPD with CO2 retention, lesions, surgery) -Refer to CPGs No Congestive heart Failure -Consider holding HBO if ejection fraction<30% No History of smoking No Bullous Disease, Blebs No Other pulmonary abnormalities No Cardiac Risks: Currently seeing a cardiologisto No Pacemaker/AICD No Hypertension No Diuretic Used (water pill). If yes, last time taken: No History of prior or current malignancy (Cancer) Surgery Yes Radiation therapy Yes If Yes for Radiation Therapy, number of treatments received: 40 Chemotherapy No Ophthalmic Risks: Optic Neuritis No [name], [name] ([id number]) Cataracts Yes Myopia No Retinopathy or Retinal Detachment Surgery- Consider pressure equalization tubes No Confinement Anxiety Claustrophobia No Dialysis Dialysis No Any implants; medical or non-medical No Pregnancy No Diabetes HgbA1C within 3 months No Seizures Seizures No Currently using these medications: Aspirin No Digoxin (CHF patient) No Narcotics No Nitroprusside No Phenothiazine (Thorazine,etc.) No Prednisone or other steroids No Disulfiram (Antabuse) No Mafenide  Acetate (Sulfamylon-burn cream) No Amiodarone No Electronic Signature(s) Signed: 04/23/2015 1:30:16 PM By: [name] Cool, RN, BSN, Kim RN, BSN Entered By: [name] Cool, RN, BSN, [name] on 04/23/2015 08:48:40

## 2015-04-23 NOTE — Progress Notes (Signed)
COLBIE, DANNER ([id number]) Visit Report for 04/23/2015 Abuse/Suicide Risk Screen Details Patient Name: [name], [name] Date of Service: 04/23/2015 8:00 AM Medical Record Number: [id number] Patient Account Number: [id number] Date of Birth/Sex: 1919-12-17 (81 y.o. Male) Treating RN: Cornell Barman Primary Care Physician: Frazier Richards Other Clinician: Referring Physician: Maryan Puls Treating Physician/Extender: Frann Rider in Treatment: 0 Abuse/Suicide Risk Screen Items Answer ABUSE/SUICIDE RISK SCREEN: Has anyone close to you tried to hurt or harm you recentlyo No Do you feel uncomfortable with anyone in your familyo No Has anyone forced you do things that you didnot want to doo No Do you have any thoughts of harming yourselfo No Patient displays signs or symptoms of abuse and/or neglect. No Electronic Signature(s) Signed: 04/23/2015 1:30:16 PM By: Gretta Cool, RN, BSN, Kim RN, BSN Entered By: Gretta Cool, RN, BSN, Kim on 04/23/2015 08:27:09 [name] ([id number]) -------------------------------------------------------------------------------- Activities of Daily Living Details Patient Name: [name] Date of Service: 04/23/2015 8:00 AM Medical Record Number: [id number] Patient Account Number: [id number] Date of Birth/Sex: September 06, 1919 (81 y.o. Male) Treating RN: Cornell Barman Primary Care Physician: Frazier Richards Other Clinician: Referring Physician: Maryan Puls Treating Physician/Extender: Frann Rider in Treatment: 0 Activities of Daily Living Items Answer Activities of Daily Living (Please select one for each item) Drive Automobile Completely Able Take Medications Completely Able Use Telephone Completely Able Care for Appearance Completely Able Use Toilet Completely Able Bath / Shower Completely Able Dress Self Completely Able Feed Self Completely Able Walk Completely Able Get In / Out Bed Completely [place] for Self Completely Able Electronic Signature(s) Signed: 04/23/2015 1:30:16 PM By: Gretta Cool, RN, BSN, Kim RN, BSN Entered By: Gretta Cool, RN, BSN, Kim on 04/23/2015 07:37:10 [name] ([id number]) -------------------------------------------------------------------------------- Education Assessment Details Patient Name: [name] Date of Service: 04/23/2015 8:00 AM Medical Record Number: [id number] Patient Account Number: [id number] Date of Birth/Sex: 1920-07-31 (81 y.o. Male) Treating RN: Cornell Barman Primary Care Physician: Frazier Richards Other Clinician: Referring Physician: Maryan Puls Treating Physician/Extender: Frann Rider in Treatment: 0 Learning Preferences/Education Level/Primary Language Highest Education Level: College or Above Preferred Language: English Cognitive Barrier Assessment/Beliefs Language Barrier: No Translator Needed: No Memory Deficit: Yes Physical Barrier Assessment Impaired Vision: Yes Glasses Impaired Hearing: Yes Hearing Aid Knowledge/Comprehension Assessment Knowledge Level: High Comprehension Level: High Ability to understand written High instructions: Ability to understand verbal High instructions: Motivation Assessment Anxiety Level: Calm Cooperation: Cooperative Education Importance: Acknowledges Need Interest in Health Problems: Asks Questions Perception: Coherent Willingness to Engage in Self- High Management Activities: Readiness to Engage in Self- High Management Activities: Electronic Signature(s) Signed: 04/23/2015 1:30:16 PM By: Gretta Cool, RN, BSN, Kim RN, BSN Entered By: Gretta Cool, RN, BSN, Kim on 04/23/2015 08:27:59 [place], [name] ([id number]) -------------------------------------------------------------------------------- Fall Risk Assessment Details Patient Name: [name] Date of Service: 04/23/2015 8:00 AM Medical Record Number: [id number] Patient Account  Number: [id number] Date of Birth/Sex: 31-May-1920 (81 y.o. Male) Treating RN: Cornell Barman Primary Care Physician: Frazier Richards Other Clinician: Referring Physician: Maryan Puls Treating Physician/Extender: Frann Rider in Treatment: 0 Fall Risk Assessment Items FALL RISK ASSESSMENT: History of falling - immediate or within 3 months 0 No Secondary diagnosis 0 No Ambulatory aid None/bed rest/wheelchair/nurse 0 Yes Crutches/cane/walker 0 No Furniture 0 No IV Access/Saline Lock 0 No Gait/Training Normal/bed rest/immobile 0 Yes Weak 0 No Impaired 0 No Mental Status Oriented to own ability 0 Yes Electronic Signature(s) Signed: 04/23/2015 1:30:16 PM By: Gretta Cool,  RN, BSN, Leisure centre manager, BSN Entered By: Gretta Cool, RN, BSN, Kim on 04/23/2015 08:28:06 [name] ([id number]) -------------------------------------------------------------------------------- Nutrition Risk Assessment Details Patient Name: [name] Date of Service: 04/23/2015 8:00 AM Medical Record Number: [id number] Patient Account Number: [id number] Date of Birth/Sex: [birth year] (81 y.o. Male) Treating RN: Cornell Barman Primary Care Physician: Frazier Richards Other Clinician: Referring Physician: Maryan Puls Treating Physician/Extender: Frann Rider in Treatment: 0 Height (in): 67 Weight (lbs): 116.8 Body Mass Index (BMI): 18.3 Nutrition Risk Assessment Items NUTRITION RISK SCREEN: I have an illness or condition that made me change the kind and/or 0 No amount of food I eat I eat fewer than two meals per day 0 No I eat few fruits and vegetables, or milk products 0 No I have three or more drinks of beer, liquor or wine almost every day 0 No I have tooth or mouth problems that make it hard for me to eat 0 No I don't always have enough money to buy the food I need 0 No I eat alone most of the time 0 No I take three or more different prescribed or over-the-counter drugs a 0 No day Without wanting to,  I have lost or gained 10 pounds in the last six 0 No months I am not always physically able to shop, cook and/or feed myself 0 No Nutrition Protocols Good Risk Protocol Provide education on Moderate Risk Protocol 0 nutrition Electronic Signature(s) Signed: 04/23/2015 1:30:16 PM By: Gretta Cool, RN, BSN, Kim RN, BSN Entered By: Gretta Cool, RN, BSN, Kim on 04/23/2015 70:01:74

## 2015-04-23 NOTE — Progress Notes (Signed)
[name], [name] ([id number]) Visit Report for 04/23/2015 Allergy List Details Patient Name: [name], [name] Date of Service: 04/23/2015 8:00 AM Medical Record Number: [id number] Patient Account Number: [id number] Date of Birth/Sex: [birth year] (81 y.o. Male) Treating RN: Cornell Barman Primary Care Physician: Frazier Richards Other Clinician: Referring Physician: Maryan Puls Treating Physician/Extender: Frann Rider in Treatment: 0 Allergies Active Allergies No Known Drug Allergies Allergy Notes Electronic Signature(s) Signed: 04/23/2015 1:30:16 PM By: Gretta Cool, RN, BSN, Kim RN, BSN Entered By: Gretta Cool, RN, BSN, Kim on 04/23/2015 08:19:41 [name] ([id number]) -------------------------------------------------------------------------------- Arrival Information Details Patient Name: [name] Date of Service: 04/23/2015 8:00 AM Medical Record Number: [id number] Patient Account Number: [id number] Date of Birth/Sex: April 29, 1920 (81 y.o. Male) Treating RN: Cornell Barman Primary Care Physician: Frazier Richards Other Clinician: Referring Physician: Maryan Puls Treating Physician/Extender: Frann Rider in Treatment: 0 Visit Information Patient Arrived: Ambulatory Arrival Time: 08:18 Accompanied By: wife Transfer Assistance: None Patient Identification Verified: Yes Secondary Verification Process Yes Completed: Patient Has Alerts: Yes Electronic Signature(s) Signed: 04/23/2015 1:30:16 PM By: Gretta Cool, RN, BSN, Kim RN, BSN Entered By: Gretta Cool, RN, BSN, Kim on 04/23/2015 08:18:47 [name] ([id number]) -------------------------------------------------------------------------------- Clinic Level of Care Assessment Details Patient Name: [name] Date of Service: 04/23/2015 8:00 AM Medical Record Number: [id number] Patient Account Number: [id number] Date of Birth/Sex: 03-02-1920 (81 y.o. Male) Treating RN: Cornell Barman Primary Care Physician: Frazier Richards Other  Clinician: Referring Physician: Maryan Puls Treating Physician/Extender: Frann Rider in Treatment: 0 Clinic Level of Care Assessment Items TOOL 2 Quantity Score []  - Use when only an EandM is performed on the INITIAL visit 0 ASSESSMENTS - Nursing Assessment / Reassessment []  - General Physical Exam (combine w/ comprehensive assessment (listed just 0 below) when performed on new pt. evals) X - Comprehensive Assessment (HX, ROS, Risk Assessments, Wounds Hx, etc.) 1 25 ASSESSMENTS - Wound and Skin Assessment / Reassessment []  - Simple Wound Assessment / Reassessment - one wound 0 []  - Complex Wound Assessment / Reassessment - multiple wounds 0 []  - Dermatologic / Skin Assessment (not related to wound area) 0 ASSESSMENTS - Ostomy and/or Continence Assessment and Care []  - Incontinence Assessment and Management 0 []  - Ostomy Care Assessment and Management (repouching, etc.) 0 PROCESS - Coordination of Care []  - Simple Patient / Family Education for ongoing care 0 []  - Complex (extensive) Patient / Family Education for ongoing care 0 X - Staff obtains Programmer, systems, Records, Test Results / Process Orders 1 10 []  - Staff telephones HHA, Nursing Homes / Clarify orders / etc 0 []  - Routine Transfer to another Facility (non-emergent condition) 0 []  - Routine Hospital Admission (non-emergent condition) 0 X - New Admissions / Biomedical engineer / Ordering NPWT, Apligraf, etc. 1 15 []  - Emergency Hospital Admission (emergent condition) 0 []  - Simple Discharge Coordination 0 [name], [name] ([id number]) []  - Complex (extensive) Discharge Coordination 0 PROCESS - Special Needs []  - Pediatric / Minor Patient Management 0 []  - Isolation Patient Management 0 []  - Hearing / Language / Visual special needs 0 []  - Assessment of Community assistance (transportation, D/C planning, etc.) 0 []  - Additional assistance / Altered mentation 0 []  - Support Surface(s) Assessment (bed, cushion, seat,  etc.) 0 INTERVENTIONS - Wound Cleansing / Measurement []  - Wound Imaging (photographs - any number of wounds) 0 []  - Wound Tracing (instead of photographs) 0 []  - Simple Wound Measurement - one wound 0 []  - Complex Wound Measurement - multiple wounds 0 []  -  Simple Wound Cleansing - one wound 0 []  - Complex Wound Cleansing - multiple wounds 0 INTERVENTIONS - Wound Dressings []  - Small Wound Dressing one or multiple wounds 0 []  - Medium Wound Dressing one or multiple wounds 0 []  - Large Wound Dressing one or multiple wounds 0 []  - Application of Medications - injection 0 INTERVENTIONS - Miscellaneous []  - External ear exam 0 []  - Specimen Collection (cultures, biopsies, blood, body fluids, etc.) 0 []  - Specimen(s) / Culture(s) sent or taken to Lab for analysis 0 []  - Patient Transfer (multiple staff / Harrel Lemon Lift / Similar devices) 0 []  - Simple Staple / Suture removal (25 or less) 0 []  - Complex Staple / Suture removal (26 or more) 0 [name], [name] ([id number]) []  - Hypo / Hyperglycemic Management (close monitor of Blood Glucose) 0 []  - Ankle / Brachial Index (ABI) - do not check if billed separately 0 Has the patient been seen at the hospital within the last three years: Yes Total Score: 50 Level Of Care: New/Established - Level 2 Electronic Signature(s) Signed: 04/23/2015 1:30:16 PM By: Gretta Cool, RN, BSN, Kim RN, BSN Entered By: Gretta Cool, RN, BSN, Kim on 04/23/2015 09:27:45 [name] ([id number]) -------------------------------------------------------------------------------- Encounter Discharge Information Details Patient Name: [name] Date of Service: 04/23/2015 8:00 AM Medical Record Number: [id number] Patient Account Number: [id number] Date of Birth/Sex: 1919/12/12 (81 y.o. Male) Treating RN: Cornell Barman Primary Care Physician: Frazier Richards Other Clinician: Referring Physician: Maryan Puls Treating Physician/Extender: Frann Rider in Treatment: 0 Encounter  Discharge Information Items Discharge Pain Level: 0 Discharge Condition: Stable Ambulatory Status: Ambulatory Discharge Destination: Home Private Transportation: Auto Accompanied By: wife Schedule Follow-up Appointment: Yes Medication Reconciliation completed and No provided to Patient/Care [facility]: Clinical Summary of Care: Electronic Signature(s) Signed: 04/23/2015 1:30:16 PM By: Gretta Cool, RN, BSN, Kim RN, BSN Entered By: Gretta Cool, RN, BSN, Kim on 04/23/2015 08:65:78 [name] ([id number]) -------------------------------------------------------------------------------- General Visit Notes Details Patient Name: [name] Date of Service: 04/23/2015 8:00 AM Medical Record Number: [id number] Patient Account Number: [id number] Date of Birth/Sex: 1919/12/25 (81 y.o. Male) Treating RN: Cornell Barman Primary Care Physician: Frazier Richards Other Clinician: Referring Physician: Maryan Puls Treating Physician/Extender: Frann Rider in Treatment: 0 Notes EKG has been scheduled for 04/24/2015 @ 10am (order faxed to [facility] in scheduling). Order for chest x-ray has been given to the patient. Electronic Signature(s) Signed: 04/23/2015 1:30:16 PM By: Gretta Cool, RN, BSN, Kim RN, BSN Entered By: Gretta Cool, RN, BSN, Kim on 04/23/2015 09:30:03 Justice, Wyonia [name] ([id number]) -------------------------------------------------------------------------------- Multi Wound Chart Details Patient Name: [name] Date of Service: 04/23/2015 8:00 AM Medical Record Number: [id number] Patient Account Number: [id number] Date of Birth/Sex: [birth year] (81 y.o. Male) Treating RN: Cornell Barman Primary Care Physician: Frazier Richards Other Clinician: Referring Physician: Maryan Puls Treating Physician/Extender: Frann Rider in Treatment: 0 Vital Signs Height(in): 67 Pulse(bpm): 54 Weight(lbs): 116.8 Blood Pressure 102/68 (mmHg): Body Mass Index(BMI): 18 Temperature(F): 97.4 Respiratory  Rate 18 (breaths/min): Wound Assessments Treatment Notes Electronic Signature(s) Signed: 04/23/2015 1:30:16 PM By: Gretta Cool, RN, BSN, Kim RN, BSN Entered By: Gretta Cool, RN, BSN, Kim on 04/23/2015 08:37:14 Rozario, Wyonia [name] ([id number]) -------------------------------------------------------------------------------- Multi-Disciplinary Care Plan Details Patient Name: [name] Date of Service: 04/23/2015 8:00 AM Medical Record Number: [id number] Patient Account Number: [id number] Date of Birth/Sex: 1920-06-24 (81 y.o. 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 Anxiety related to knowledge deficit of hyperbaric oxygen therapy and treatment procedures Discomfort related to temperature and humidity changes inside hyperbaric chamber Potential for barotraumas to ears, sinuses, teeth, and lungs or cerebral gas embolism related to changes in atmospheric pressure inside hyperbaric oxygen chamber Potential for oxygen toxicity seizures related to delivery of 100% oxygen at an increased atmospheric pressure Potential for pulmonary oxygen toxicity related to delivery of 100% oxygen at an increased atmospheric pressure Goals: Barotrauma will be prevented during HBO2 Date Initiated: 04/23/2015 Goal Status: Active Patient and/or family will be able to state/discuss factors appropriate to the management of their disease process during treatment Date Initiated: 04/23/2015 Goal Status: Active Patient will tolerate the hyperbaric oxygen therapy treatment Date Initiated: 04/23/2015 Goal Status: Active Patient will tolerate the internal climate of the chamber Date Initiated: 04/23/2015 Goal Status: Active Patient/caregiver will  verbalize understanding of HBO goals, rationale, procedures and potential hazards Date Initiated: 04/23/2015 Goal Status: Active Signs and symptoms of pulmonary oxygen toxicity will be recognized and promptly addressed Date Initiated: 04/23/2015 Goal Status: Active Signs and symptoms of seizure will be recognized and promptly addressed ; seizing patients will suffer no harm Date Initiated: 04/23/2015 [name] ([id number]) Goal Status: Active Interventions: Administer the correct therapeutic gas delivery based on the patients needs and limitations, per physician order Assess and provide for patientos comfort related to the hyperbaric environment and equalization of middle ear Assess for signs and symptoms related to adverse events, including but not limited to confinement anxiety, pneumothorax, oxygen toxicity and baurotrauma Assess patient for any history of confinement anxiety Assess patient's knowledge and expectations regarding hyperbaric medicine and provide education related to the hyperbaric environment, goals of treatment and prevention of adverse events Implement protocols to decrease risk of pneumothorax in high risk patients Notes: Orientation to the Wound Care Program Nursing Diagnoses: Knowledge deficit related to the wound healing center program Goals: Patient/caregiver will verbalize understanding of the [place] Program Date Initiated: 04/23/2015 Goal Status: Active Interventions: Provide education on orientation to the wound center Notes: Electronic Signature(s) Signed: 04/23/2015 1:30:16 PM By: Gretta Cool, RN, BSN, Kim RN, BSN Entered By: Gretta Cool, RN, BSN, Kim on 04/23/2015 08:29:46 [place], Wyonia [name] ([id number]) -------------------------------------------------------------------------------- Patient/Caregiver Education Details Patient Name: [name] Date of Service: 04/23/2015 8:00 AM Medical Record Number: [id number] Patient Account Number:  [id number] Date of Birth/Gender: June 24, 1920 (81 y.o. Male) Treating RN: Cornell Barman Primary Care Physician: Frazier Richards Other Clinician: Referring Physician: Maryan Puls Treating Physician/Extender: Frann Rider in Treatment: 0 Education Assessment Education Provided To: Patient Education Topics Provided Hyperbaric Oxygenation: Handouts: Hyperbaric Oxygen Methods: Demonstration, Explain/Verbal Responses: State content correctly Welcome To [facility]: Methods: Demonstration Responses: State content correctly Electronic Signature(s) Signed: 04/23/2015 1:30:16 PM By: Gretta Cool, RN, BSN, Kim RN, BSN Entered By: Gretta Cool, RN, BSN, Kim on 04/23/2015 09:28:49 [name] ([id number]) -------------------------------------------------------------------------------- [place] Details Patient Name: [name] Date of Service: 04/23/2015 8:00 AM Medical Record Number: [id number] Patient Account Number: [id number] Date of Birth/Sex: 1919-08-29 (81 y.o. Male) Treating RN: Cornell Barman Primary Care Physician: Frazier Richards Other Clinician: Referring Physician: Maryan Puls Treating Physician/Extender: Frann Rider in Treatment: 0 Vital Signs Time Taken: 08:18 Temperature (F): 97.4 Height (in): 67 Pulse (bpm): 54 Source: Stated Respiratory Rate (breaths/min): 18 Weight (lbs): 116.8 Blood Pressure (mmHg): 102/68 Source: Measured Reference Range: 80 - 120 mg / dl Body Mass Index (BMI): 18.3 Electronic Signature(s) Signed: 04/23/2015  1:30:16 PM By: Gretta Cool, RN, BSN, Kim RN, BSN Entered By: Gretta Cool, RN, BSN, Kim on 04/23/2015 24:26:83

## 2015-04-24 ENCOUNTER — Other Ambulatory Visit: Payer: Self-pay | Admitting: Surgery

## 2015-04-24 ENCOUNTER — Ambulatory Visit
Admission: RE | Admit: 2015-04-24 | Discharge: 2015-04-24 | Disposition: A | Discharge status: Home / Self Care | Payer: Medicare Other | Source: Ambulatory Visit | Attending: Surgery | Admitting: Surgery

## 2015-04-24 ENCOUNTER — Ambulatory Visit
Admission: RE | Admit: 2015-04-24 | Discharge: 2015-04-24 | Disposition: A | Discharge status: Home / Self Care | Payer: Medicare Other | Source: Ambulatory Visit | Attending: Internal Medicine | Admitting: Internal Medicine

## 2015-04-24 DIAGNOSIS — Z9289 Personal history of other medical treatment: Secondary | ICD-10-CM

## 2015-04-24 DIAGNOSIS — Z9889 Other specified postprocedural states: Secondary | ICD-10-CM | POA: Diagnosis present

## 2015-04-24 NOTE — Progress Notes (Signed)
BREWER, HITCHMAN ([id number]) Visit Report for 04/23/2015 Chief Complaint Document Details Patient Name: [name] Date of Service: 04/23/2015 8:00 AM Medical Record Number: [id number] Patient Account Number: [id number] Date of Birth/Sex: [birth year] (81 y.o. Male) Treating RN: Cornell Barman Primary Care Physician: Frazier Richards Other Clinician: Referring Physician: Maryan Puls Treating Physician/Extender: Frann Rider in Treatment: 0 Information Obtained from: Patient Chief Complaint Patient presents to the [facility] Care center for HBO eval due to recent diagnosis of radiation cystitis. He was seen by his urologist and taken for a procedure at the end of August. Electronic Signature(s) Signed: 04/23/2015 9:10:25 AM By: Christin Fudge MD, FACS Entered By: Christin Fudge on 04/23/2015 09:10:24 Buehrle, [name] ([id number]) -------------------------------------------------------------------------------- HPI Details Patient Name: [name] Date of Service: 04/23/2015 8:00 AM Medical Record Number: [id number] Patient Account Number: [id number] Date of Birth/Sex: 12-May-1920 (81 y.o. Male) Treating RN: Cornell Barman Primary Care Physician: Frazier Richards Other Clinician: Referring Physician: Maryan Puls Treating Physician/Extender: Frann Rider in Treatment: 0 History of Present Illness Location: bleeding from the urinary bladder Quality: Patient reports No Pain. Severity: Patient states wound (s) are getting better. Duration: Patient has had the wound for < 4 weeks prior to presenting for treatment Context: The wound occurred when the patient suddenly started having profuse bleeding or any passing urine. Modifying Factors: Other treatment(s) tried include:he has finished radiation for his prostate cancer approximately one year ago Associated Signs and Symptoms: Patient reports having:having clear urine now and no acute problem. HPI Description: 81 year old gentleman  who has been referred to was by his urologist Dr. 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 He was taken to the operating room for a cystoscopy and the need for was done. The patient had prostate cancer in the mid 90s and had an open procedure done at that stage. He later continued to have problems and had radiation therapy 40 settings approximately a year ago. 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 His past medical history is significant for essential tremor, status post prostatectomy in 1995, sleep apnea. Electronic Signature(s) Signed: 04/23/2015 9:14:19 AM By: Christin Fudge MD, FACS Entered By: Christin Fudge on 04/23/2015 09:14:19 Tretter, [name] ([id number]) -------------------------------------------------------------------------------- Physical Exam Details Patient Name: [name] Date of Service: 04/23/2015 8:00 AM Medical Record Number: [id number] Patient Account Number: [id number] Date of Birth/Sex: Jan 17, 1920 (81 y.o. Male) Treating RN: Cornell Barman Primary Care Physician: Frazier Richards Other Clinician: Referring Physician: Maryan Puls Treating Physician/Extender: Frann Rider in Treatment: 0 Eyes Nonicteric. Reactive to light. Ears, Nose, Mouth, and Throat Lips, teeth, and gums WNL.Marland Kitchen Moist mucosa without lesions . Neck supple and nontender. No palpable supraclavicular or cervical adenopathy. Normal sized without goiter. Respiratory WNL. No retractions.. Cardiovascular Pedal Pulses WNL. No clubbing, cyanosis or edema. Gastrointestinal (GI) Abdomen without masses or tenderness.. No liver or spleen enlargement or tenderness.. Lymphatic No adneopathy. No adenopathy. No adenopathy. Musculoskeletal Adexa without tenderness or enlargement.. Digits and nails w/o clubbing, cyanosis, infection, petechiae, ischemia, or inflammatory conditions.. Integumentary (Hair, Skin) No suspicious lesions. No  crepitus or fluctuance. No peri-wound warmth or erythema. No masses.Marland Kitchen Psychiatric Judgement and insight Intact.. No evidence of depression, anxiety, or agitation.. Electronic Signature(s) Signed: 04/23/2015 9:14:48 AM By: Christin Fudge MD, FACS Entered By: Christin Fudge on 04/23/2015 09:14:46 Juba, [name] ([id number]) -------------------------------------------------------------------------------- Physician Orders Details Patient Name: [name] Date of Service: 04/23/2015 8:00 AM Medical Record Number: [id number] Patient Account Number: [id number] Date of  Birth/Sex: [birth year] (80 y.o. Male) Treating RN: Cornell Barman Primary Care Physician: Frazier Richards Other Clinician: Referring Physician: Maryan Puls Treating Physician/Extender: Frann Rider in Treatment: 0 Verbal / Phone Orders: Yes Clinician: Cornell Barman Read Back and Verified: Yes Diagnosis Coding Hyperbaric Oxygen Therapy o Evaluate for HBO Therapy o Indication: - Radiation cystitis o If appropriate for treatment, begin HBOT per protocol: o 2.5 ATA for 90 Minutes with 2 Five (5) Minute Air Breaks o One treatment per day (delivered Monday through Friday unless otherwise specified in Special Instructions below): o Total # of Treatments: - 30 Consults o Radiology - EKG oooo Radiology o X-ray, Chest oooo Electronic Signature(s) Signed: 04/23/2015 1:30:16 PM By: Gretta Cool, RN, BSN, Kim RN, BSN Signed: 04/23/2015 4:21:04 PM By: Christin Fudge MD, FACS Entered By: Gretta Cool, RN, BSN, Kim on 04/23/2015 08:44:49 Simar, [name] ([id number]) -------------------------------------------------------------------------------- Problem List Details Patient Name: [name] Date of Service: 04/23/2015 8:00 AM Medical Record Number: [id number] Patient Account Number: [id number] Date of Birth/Sex: 1919-11-28 (81 y.o. 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 Male) Treating RN: Cornell Barman Primary Care Physician: Frazier Richards Other Clinician: Referring Physician: Maryan Puls Treating Physician/Extender: Frann Rider in Treatment: 0 Subjective Chief Complaint Information obtained from Patient Patient presents to the Wound Care center for HBO eval due to recent diagnosis of radiation cystitis. He was seen by his urologist and taken for a procedure at the end of August. History of Present Illness (HPI) The following HPI elements were documented for the patient's wound: Location: bleeding from the urinary bladder Quality: Patient reports No Pain. Severity: Patient states wound (s) are getting better. Duration: Patient has had the wound for < 4 weeks prior to presenting for treatment Context: The wound occurred when the patient suddenly started having profuse bleeding or any passing urine. Modifying Factors: Other treatment(s) tried include:he has finished radiation for his prostate cancer approximately one year ago Associated Signs and Symptoms: Patient reports having:having clear urine now and no acute problem. 81 year old gentleman who has been referred to was by his  urologist Dr. 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 He was taken to the operating room for a cystoscopy and the need for was done. The patient had prostate cancer in the mid 90s and had an open procedure done at that stage. He later continued to have problems and had radiation therapy 40 settings approximately a year ago. 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 His past medical history is significant for essential tremor, status post prostatectomy in 1995, sleep apnea. Wound History Patient reportedly has not tested positive for osteomyelitis. Patient reportedly has not had testing performed to evaluate circulation in the legs. Patient History Information obtained from Patient. Allergies No Known Drug Allergies Family History Cancer - Child, Black, Guenther W. ([id number]) No family history of Diabetes, Heart Disease, Hereditary Spherocytosis, Hypertension, Kidney Disease, Lung Disease, Seizures, Stroke,  Thyroid Problems, Tuberculosis. Social History Never smoker, Marital Status - Married, Alcohol Use - Never, Drug Use - No History, Caffeine Use - Daily. Medical History Eyes Patient has history of Cataracts Denies history of Glaucoma, Optic Neuritis Ear/Nose/Mouth/Throat Denies history of Chronic sinus problems/congestion, Middle ear problems Hematologic/Lymphatic Denies history of Anemia, Hemophilia, Human Immunodeficiency Virus, Lymphedema, Sickle Cell Disease Respiratory Patient has history of Sleep Apnea Denies history of Aspiration, Asthma, Chronic Obstructive Pulmonary Disease (COPD), Pneumothorax, Tuberculosis Cardiovascular Denies history of Angina, Arrhythmia, Congestive Heart Failure, Coronary Artery Disease, Deep Vein Thrombosis, Hypertension, Hypotension, Myocardial Infarction, Peripheral Arterial Disease, Peripheral Venous Disease, Phlebitis, Vasculitis Gastrointestinal Denies history of Cirrhosis ,  Colitis, Crohn s, Hepatitis A, Hepatitis B, Hepatitis C Endocrine Denies history of Type I Diabetes Genitourinary Denies history of End Stage Renal Disease Immunological Denies history of Lupus Erythematosus, Raynaud s, Scleroderma Integumentary (Skin) Denies history of History of Burn, History of pressure wounds Musculoskeletal Denies history of Gout, Rheumatoid Arthritis, Osteoarthritis, Osteomyelitis Neurologic Denies history of Dementia, Neuropathy, Quadriplegia, Paraplegia, Seizure Disorder Oncologic Patient has history of Received Radiation Denies history of Received Chemotherapy Psychiatric Denies history of Anorexia/bulimia, Confinement Anxiety Medical And Surgical History Notes Constitutional Symptoms (General Health) Radiation Prostate Cancer; weight loss; sleep apnea Review of Systems (ROS) Constitutional Symptoms (General Health) The patient has no complaints or symptoms. Eyes Complains or has symptoms of Glasses / Contacts. FELIPE, PALUCH ([id number]) Ear/Nose/Mouth/Throat The patient has no complaints or symptoms. Hematologic/Lymphatic The patient has no complaints or symptoms. Respiratory The patient has no complaints or symptoms. Cardiovascular The patient has no complaints or symptoms. Gastrointestinal The patient has no complaints or symptoms. Endocrine Denies complaints or symptoms of Hepatitis, Thyroid disease, Polydypsia (Excessive Thirst). Genitourinary Denies complaints or symptoms of Kidney failure/ Dialysis, Incontinence/dribbling. Immunological Denies complaints or symptoms of Hives, Itching. Integumentary (Skin) Complains or has symptoms of Wounds, Bleeding or bruising tendency. Denies complaints or symptoms of Breakdown, Swelling. Musculoskeletal Denies complaints or symptoms of Muscle Pain. Neurologic Denies complaints or symptoms of Numbness/parasthesias, Focal/Weakness. Oncologic Prostate Cancer Psychiatric The patient has no  complaints or symptoms. Medications oxycodone-acetaminophen 5 mg-325 mg tablet oral tablet oral docusate sodium 100 mg tablet oral 1 1 tablet oral magnesium citrate oral solution oral solution oral Objective Constitutional Vitals Time Taken: 8:18 AM, Height: 67 in, Source: Stated, Weight: 116.8 lbs, Source: Measured, BMI: 18.3, Temperature: 97.4 F, Pulse: 54 bpm, Respiratory Rate: 18 breaths/min, Blood Pressure: 102/68 mmHg. Eyes Neiswender, Calix W. ([id number]) Nonicteric. Reactive to light. Ears, Nose, Mouth, and Throat Lips, teeth, and gums WNL.Marland Kitchen Moist mucosa without lesions . Neck supple and nontender. No palpable supraclavicular or cervical adenopathy. Normal sized without goiter. Respiratory WNL. No retractions.. Cardiovascular Pedal Pulses WNL. No clubbing, cyanosis or edema. Gastrointestinal (GI) Abdomen without masses or tenderness.. No liver or spleen enlargement or tenderness.. Lymphatic No adneopathy. No adenopathy. No adenopathy. Musculoskeletal Adexa without tenderness or enlargement.. Digits and nails w/o clubbing, cyanosis, infection, petechiae, ischemia, or inflammatory conditions.Marland Kitchen Psychiatric Judgement and insight Intact.. No evidence of depression, anxiety, or agitation.. Integumentary (Hair, Skin) No suspicious lesions. No crepitus or fluctuance. No peri-wound warmth or erythema. No masses.. 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 He has had prostate cancer and has now been radiated approximately a year ago and has late [place], [place]. ([id number]) effects of radiation with a radiation cystitis which has  been quite perfuse recently and had to be taken to the operating room for a procedure. His urologist and I both agree that he will benefit immensely from hyperbaric oxygen  therapy and I will work him up towards this. I have recommended a chest x-ray and EKG, and clearance from his insurance company. Risk benefits alternatives and all the possible complications of hyperbaric oxygen therapy has been discussed with him and he and his wife had all questions answered. We will proceed with hyperbaric oxygen therapy as soon as everything is in order. Plan Hyperbaric Oxygen Therapy: Evaluate for HBO Therapy Indication: - Radiation cystitis If appropriate for treatment, begin HBOT per protocol: 2.5 ATA for 90 Minutes with 2 Five (5) Minute Air Breaks One treatment per day (delivered Monday through Friday unless otherwise specified in Special Instructions below): Total # of Treatments: - 50 Radiology ordered were: X-ray, Chest Consults ordered were: Radiology - EKG 81 year old gentleman who is fairly healthy for his age and has few chronic issues,except including sleep apnea. He has had prostate cancer and has now been radiated approximately a year ago and has late effects of radiation with a radiation cystitis which has been quite perfuse recently and had to be taken to the operating room for a procedure. His urologist and I both agree that he will benefit immensely from hyperbaric oxygen therapy and I will work him up towards this. I have recommended a chest x-ray and EKG, and clearance from his insurance company. Risk benefits alternatives and all the possible complications of hyperbaric oxygen therapy has been discussed with him and he and his wife had all questions answered. We will proceed with hyperbaric oxygen therapy as soon as everything is in order. Electronic Signature(s) Signed: 04/23/2015 10:03:15 AM By: Christin Fudge MD, FACS Previous Signature: 04/23/2015 10:02:54 AM Version By: Christin Fudge MD, FACS Doeden, Stevan W. ([id number]) Previous Signature: 04/23/2015 9:17:04 AM Version By: Christin Fudge MD, FACS Entered By: Christin Fudge on 04/23/2015  10:03:15 [name] ([id number]) -------------------------------------------------------------------------------- ROS/PFSH Details Patient Name: [name] Date of Service: 04/23/2015 8:00 AM Medical Record Number: [id number] Patient Account Number: [id number] Date of Birth/Sex: 06/24/1920 (81 y.o. Male) Treating RN: Cornell Barman Primary Care Physician: Frazier Richards Other Clinician: Referring Physician: Maryan Puls Treating Physician/Extender: Frann Rider in Treatment: 0 Information Obtained From Patient Wound History Do you currently have one or more open woundso No Have you tested positive for osteomyelitis (bone infection)o No Have you had any tests for circulation on your legso No Eyes Complaints and Symptoms: Positive for: Glasses / Contacts Medical History: Positive for: Cataracts Negative for: Glaucoma; Optic Neuritis Endocrine Complaints and Symptoms: Negative for: Hepatitis; Thyroid disease; Polydypsia (Excessive Thirst) Medical History: Negative for: Type I Diabetes Genitourinary Complaints and Symptoms: Negative for: Kidney failure/ Dialysis; Incontinence/dribbling Medical History: Negative for: End Stage Renal Disease Immunological Complaints and Symptoms: Negative for: Hives; Itching Medical History: Negative for: Lupus Erythematosus; Raynaudos; Scleroderma Integumentary (Skin) Pellman, Buddie W. ([id number]) Complaints and Symptoms: Positive for: Wounds; Bleeding or bruising tendency Negative for: Breakdown; Swelling Medical History: Negative for: History of Burn; History of pressure wounds Musculoskeletal Complaints and Symptoms: Negative for: Muscle Pain Medical History: Negative for: Gout; Rheumatoid Arthritis; Osteoarthritis; Osteomyelitis Neurologic Complaints and Symptoms: Negative for: Numbness/parasthesias; Focal/Weakness Medical History: Negative for: Dementia; Neuropathy; Quadriplegia; Paraplegia; Seizure  Disorder Constitutional Symptoms (General Health) Complaints and Symptoms: No Complaints or Symptoms Medical History: Past Medical History Notes: Radiation Prostate Cancer; weight loss; sleep apnea Ear/Nose/Mouth/Throat Complaints and Symptoms: No Complaints  or Symptoms Medical History: Negative for: Chronic sinus problems/congestion; Middle ear problems Hematologic/Lymphatic Complaints and Symptoms: No Complaints or Symptoms Medical History: Negative for: Anemia; Hemophilia; Human Immunodeficiency Virus; Lymphedema; Sickle Cell Disease Respiratory Complaints and Symptoms: No Complaints or Symptoms Linville, Susano W. ([id number]) Medical History: Positive for: Sleep Apnea Negative for: Aspiration; Asthma; Chronic Obstructive Pulmonary Disease (COPD); Pneumothorax; Tuberculosis Cardiovascular Complaints and Symptoms: No Complaints or Symptoms Medical History: Negative for: Angina; Arrhythmia; Congestive Heart Failure; Coronary Artery Disease; Deep Vein Thrombosis; Hypertension; Hypotension; Myocardial Infarction; Peripheral Arterial Disease; Peripheral Venous Disease; Phlebitis; Vasculitis Gastrointestinal Complaints and Symptoms: No Complaints or Symptoms Medical History: Negative for: Cirrhosis ; Colitis; Crohnos; Hepatitis A; Hepatitis B; Hepatitis C Oncologic Complaints and Symptoms: Review of System Notes: Prostate Cancer Medical History: Positive for: Received Radiation Negative for: Received Chemotherapy Psychiatric Complaints and Symptoms: No Complaints or Symptoms Medical History: Negative for: Anorexia/bulimia; Confinement Anxiety HBO Extended History Items Eyes: Cataracts Family and Social History Cancer: Yes - Child; Diabetes: No; Heart Disease: No; Hereditary Spherocytosis: No; Hypertension: No; Kidney Disease: No; Lung Disease: No; Seizures: No; Stroke: No; Thyroid Problems: No; Tuberculosis: No; Never smoker; Marital Status - Married; Alcohol Use:  Never; Drug Use: No History; Caffeine Use: Daily; Living Will: Yes (Copy provided); Medical Power of Attorney: Yes (Not Provided) Karan, Taseen W. ([id number]) Physician Affirmation I have reviewed and agree with the above information. Electronic Signature(s) Signed: 04/23/2015 10:02:35 AM By: Christin Fudge MD, FACS Signed: 04/23/2015 1:30:16 PM By: Gretta Cool RN, BSN, Kim RN, BSN Entered By: Christin Fudge on 04/23/2015 10:02:35 [name] ([id number]) -------------------------------------------------------------------------------- SuperBill Details Patient Name: [name] Date of Service: 04/23/2015 Medical Record Number: [id number] Patient Account Number: [id number] Date of Birth/Sex: 06/29/1920 (81 y.o. 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

## 2015-04-27 ENCOUNTER — Ambulatory Visit
Admission: RE | Admit: 2015-04-27 | Discharge: 2015-04-27 | Disposition: A | Discharge status: Home / Self Care | Payer: Medicare Other | Source: Ambulatory Visit | Attending: Urology | Admitting: Urology

## 2015-04-27 DIAGNOSIS — I700 Atherosclerosis of aorta: Secondary | ICD-10-CM | POA: Insufficient documentation

## 2015-04-27 DIAGNOSIS — N281 Cyst of kidney, acquired: Secondary | ICD-10-CM | POA: Diagnosis not present

## 2015-04-27 DIAGNOSIS — N200 Calculus of kidney: Secondary | ICD-10-CM | POA: Diagnosis not present

## 2015-04-27 DIAGNOSIS — N400 Enlarged prostate without lower urinary tract symptoms: Secondary | ICD-10-CM | POA: Insufficient documentation

## 2015-04-27 DIAGNOSIS — R310 Gross hematuria: Secondary | ICD-10-CM | POA: Insufficient documentation

## 2015-04-27 DIAGNOSIS — M419 Scoliosis, unspecified: Secondary | ICD-10-CM | POA: Insufficient documentation

## 2015-04-27 MED ORDER — IOHEXOL 300 MG/ML  SOLN
100.0000 mL | Freq: Once | INTRAMUSCULAR | Status: AC | PRN
  Administered 2015-04-27: 100 mL via INTRAVENOUS

## 2015-05-05 ENCOUNTER — Encounter: Payer: Medicare Other | Admitting: Surgery

## 2015-05-05 DIAGNOSIS — N3041 Irradiation cystitis with hematuria: Secondary | ICD-10-CM | POA: Diagnosis not present

## 2015-05-05 NOTE — Progress Notes (Signed)
[name] ([id number]) Visit Report for 05/05/2015 HBO Details Patient Name: [name], [name]. Date of Service: 05/05/2015 8:00 AM Medical Record Number: [id number] Patient Account Number: [id number] Date of Birth/Sex: 03/01/1920 (81 y.o. Male) Treating RN: Primary Care Physician: Frazier Richards Other Clinician: Jacqulyn Bath Referring Physician: Frazier Richards Treating Physician/Extender: Frann Rider in Treatment: 1 HBO Treatment Course Details Treatment Course Ordering Physician: Christin Fudge 1 Number: HBO Treatment Start Date: 05/05/2015 Total Treatments 40 Ordered: HBO Indication: Late Effect of Radiation HBO Treatment Details Treatment Number: 1 Patient Type: Outpatient Chamber Type: Monoplace Chamber #: HBO #[id number] Treatment Protocol: 2.0 ATA with 90 minutes oxygen, and no air breaks Treatment Details Compression Rate Down: 1.5 psi / minute De-Compression Rate Up: 1.5 psi / minute Air breaks and breathing Compress Tx Pressure Decompress Decompress periods Begins Reached Begins Ends (leave unused spaces blank) Chamber Pressure 1 ATA 2.0 ATA - - - - - - 2.0 ATA 1 ATA Clock Time (24 hr) 08:36 08:46 - - - - - - 10:16 10:26 Treatment Length: 110 (minutes) Treatment Segments: 4 Capillary Blood Glucose Pre Capillary Blood Glucose (mg/dl): Post Capillary Blood Glucose (mg/dl): Vital Signs Capillary Blood Glucose Reference Range: 80 - 120 mg / dl HBO Diabetic Blood Glucose Intervention Range: <131 mg/dl or >249 mg/dl Time Vitals Blood Respiratory Capillary Blood Glucose Pulse Action Type: Pulse: Temperature: Taken: Pressure: Rate: Glucose (mg/dl): Meter #: Oximetry (%) Taken: Pre 07:50 116/72 54 16 97.7 Post 10:30 112/58 54 96.3 Pre-Treatment Ear Evaluation Left Right Clear: Yes Clear: Yes [name]. ([id number]) PE Tubes inserted: No PE Tubes inserted: No Left Teed Scale: Grade 0 Right Teed Scale: Grade 0 Treatment Response Treatment  Completion Status: Treatment Completed without Adverse Event Physician Notes Pretreatment his heart and lungs sounded fine and his ears looked okay. HBO Attestation I certify that I supervised this HBO treatment in accordance with Medicare guidelines. A trained Yes emergency response team is readily available per hospital policies and procedures. Continue HBOT as ordered. Yes Electronic Signature(s) Signed: 05/05/2015 11:12:21 AM By: Christin Fudge MD, FACS Entered By: Christin Fudge on 05/05/2015 11:12:21 [name] ([id number]) -------------------------------------------------------------------------------- HBO Safety Checklist Details Patient Name: [name] Date of Service: 05/05/2015 8:00 AM Medical Record Number: [id number] Patient Account Number: [id number] Date of Birth/Sex: [birth year] (81 y.o. Male) Treating RN: Primary Care Physician: Frazier Richards Other Clinician: Jacqulyn Bath Referring Physician: Frazier Richards Treating Physician/Extender: Frann Rider in Treatment: 1 HBO Safety Checklist Items Safety Checklist Consent Form Signed Patient voided / foley secured and emptied When did you last eato 05:00 am Last dose of injectable or oral agent n/a NA Ostomy pouch emptied and vented if applicable NA All implantable devices assessed, documented and approved NA Intravenous access site secured and place Valuables secured Linens and cotton and cotton/polyester blend (less than 51% polyester) Personal oil-based products / skin lotions / body lotions removed Wigs or hairpieces removed Smoking or tobacco materials removed Books / newspapers / magazines / loose paper removed Cologne, aftershave, perfume and deodorant removed Jewelry removed (may wrap wedding band) Make-up removed Hair care products removed Battery operated devices (external) removed Heating patches and chemical warmers removed NA Titanium eyewear removed NA Nail polish cured  greater than 10 hours NA Casting material cured greater than 10 hours Hearing aids removed Loose dentures or partials removed NA Prosthetics have been removed Patient demonstrates correct use of air break device (if applicable) Patient concerns have been addressed Patient grounding bracelet on and cord attached  to chamber Specifics for Inpatients (complete in addition to above) Medication sheet sent with patient Intravenous medications needed or due during therapy sent with patient [name]. ([id number]) Drainage tubes (e.g. nasogastric tube or chest tube secured and vented) Endotracheal or Tracheotomy tube secured Cuff deflated of air and inflated with saline Airway suctioned Electronic Signature(s) Signed: 05/05/2015 3:00:55 PM By: Lorine Bears RCP, RRT, CHT Entered By: Lorine Bears on 05/05/2015 08:40:03

## 2015-05-05 NOTE — Progress Notes (Signed)
WALI, REINHEIMER ([id number]) Visit Report for 05/05/2015 Arrival Information Details Patient Name: [name], [name] Date of Service: 05/05/2015 8:00 AM Medical Record Number: [id number] Patient Account Number: [id number] Date of Birth/Sex: [birth year] (81 y.o. Male) Treating RN: Primary Care Physician: Frazier Richards Other Clinician: Jacqulyn Bath Referring Physician: Frazier Richards Treating Physician/Extender: Frann Rider in Treatment: 1 Visit Information History Since Last Visit Added or deleted any medications: No Patient Arrived: Ambulatory Any new allergies or adverse reactions: No Arrival Time: 07:50 Had a fall or experienced change in No Accompanied By: wife activities of daily living that may affect Transfer Assistance: None risk of falls: Patient Identification Verified: Yes Signs or symptoms of abuse/neglect since last No Secondary Verification Process Yes visito Completed: Hospitalized since last visit: No Patient Has Alerts: Yes Pain Present Now: No Electronic Signature(s) Signed: 05/05/2015 3:00:55 PM By: Lorine Bears RCP, RRT, CHT Entered By: Becky Sax, Amado Nash on 05/05/2015 08:38:26 [place], [name] ([id number]) -------------------------------------------------------------------------------- Encounter Discharge Information Details Patient Name: [name] Date of Service: 05/05/2015 8:00 AM Medical Record Number: [id number] Patient Account Number: [id number] Date of Birth/Sex: 04/16/1920 (81 y.o. Male) Treating RN: Primary Care Physician: Frazier Richards Other Clinician: Jacqulyn Bath Referring Physician: Frazier Richards Treating Physician/Extender: Frann Rider in Treatment: 1 Encounter Discharge Information Items Discharge Pain Level: 0 Discharge Condition: Stable Ambulatory Status: Ambulatory Discharge Destination: Home Private Transportation: Auto Accompanied By: wife Schedule Follow-up  Appointment: No Medication Reconciliation completed and No provided to Patient/Care Provider: Clinical Summary of Care: Notes Patient has an HBO treatment scheduled on 05/06/15 at 08:00 am. Electronic Signature(s) Signed: 05/05/2015 3:00:55 PM By: Lorine Bears RCP, RRT, CHT Entered By: Becky Sax, Amado Nash on 05/05/2015 11:03:04 [place], [name] ([id number]) -------------------------------------------------------------------------------- Vitals Details Patient Name: [name] Date of Service: 05/05/2015 8:00 AM Medical Record Number: [id number] Patient Account Number: [id number] Date of Birth/Sex: Dec 11, 1919 (81 y.o. Male) Treating RN: Primary Care Physician: Frazier Richards Other Clinician: Jacqulyn Bath Referring Physician: Frazier Richards Treating Physician/Extender: Frann Rider in Treatment: 1 Vital Signs Time Taken: 07:50 Temperature (F): 97.7 Height (in): 67 Pulse (bpm): 54 Weight (lbs): 116.8 Respiratory Rate (breaths/min): 16 Body Mass Index (BMI): 18.3 Blood Pressure (mmHg): 116/72 Reference Range: 80 - 120 mg / dl Electronic Signature(s) Signed: 05/05/2015 3:00:55 PM By: Lorine Bears RCP, RRT, CHT Entered By: Becky Sax, Amado Nash on 05/05/2015 08:38:55

## 2015-05-06 ENCOUNTER — Encounter: Payer: Medicare Other | Admitting: Surgery

## 2015-05-06 DIAGNOSIS — N3041 Irradiation cystitis with hematuria: Secondary | ICD-10-CM | POA: Diagnosis not present

## 2015-05-06 NOTE — Progress Notes (Signed)
[name], [name] ([id number]) Visit Report for 05/05/2015 Physician Orders Details Patient Name: [name], [name]. Date of Service: 05/05/2015 8:00 AM Medical Record Number: [id number] Patient Account Number: [id number] Date of Birth/Sex: 12-Nov-1919 (81 y.o. Male) Treating RN: [name] Primary Care Physician: [name] Other Clinician: Jacqulyn [name] Referring Physician: Frazier [name] Treating Physician/Extender: [name] in Treatment: 1 Verbal / Phone Orders: Yes Clinician: Cornell [name] Read Back and Verified: Yes Diagnosis Coding Hyperbaric Oxygen Therapy o Indication: - radiation cystitis o If appropriate for treatment, begin HBOT per protocol: o 2.0 ATA for 46 Minutes without Air Breaks o One treatment per day (delivered Monday through Friday unless otherwise specified in Special Instructions below): o Total # of Treatments: - 40 HBO Contraindications Other Extremity o HBO contraindications of hyperbaric oxygen therapy were reviewed and the patient found to have no untreated pneumothorax or history of spontaneous pneumothorax. o HBO contraindications of hyperbaric oxygen therapy were reviewed and the patient found to have no history of medications such as Bleomycin, Adriamycin, disulfiram, cisplatin and sulfamylon and is not currently receiving any chemotherapy. o HBO contraindications of hyperbaric oxygen therapy were reviewed and the patient found to have no Upper respiratory infection and chronic sinusitis. o HBO contraindications of hyperbaric oxygen therapy were reviewed and the patient found to have no history of retinal surgery proceeding 6 weeks or intraocular gas o HBO contraindications of hyperbaric oxygen therapy were reviewed and the patient found to have no history seizure disorder or any anticonvulsant medication. o HBO contraindications of hyperbaric oxygen therapy were reviewed and the patient found to have no septicemia  with CO2 retention. o HBO contraindications of hyperbaric oxygen therapy were reviewed and the patient found to have no fever greater than 100 degrees. o HBO contraindications of hyperbaric oxygen therapy were reviewed and the patient found to have no pregnancy noted. o HBO contraindications of hyperbaric oxygen therapy were reviewed and the patient found to have no medications such as steroids or narcotics or Phenergan. Electronic Signature(s) Signed: 05/05/2015 3:53:46 PM By: [name] Fudge MD, FACS Signed: 05/05/2015 5:26:34 PM By: [name] Cool RN, BSN, Kim RN, BSN Entered By: [name] Cool, RN, BSN, [name] on 05/05/2015 08:32:14 [name] ([id number]) [name] ([id number]) -------------------------------------------------------------------------------- [facility] Details Patient Name: [name] Date of Service: 05/05/2015 Medical Record Number: [id number] Patient Account Number: [id number] Date of Birth/Sex: 09/18/1919 (81 y.o. 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

## 2015-05-07 ENCOUNTER — Encounter: Payer: Medicare Other | Admitting: Surgery

## 2015-05-07 DIAGNOSIS — N3041 Irradiation cystitis with hematuria: Secondary | ICD-10-CM | POA: Diagnosis not present

## 2015-05-07 NOTE — Progress Notes (Signed)
[name] ([id number]) Visit Report for 05/06/2015 Arrival Information Details Patient Name: [name] Date of Service: 05/06/2015 8:00 AM Medical Record Number: [id number] Patient Account Number: [id number] Date of Birth/Sex: 07-14-1920 (81 y.o. Male) Treating RN: Primary Care Physician: Frazier Richards Other Clinician: Jacqulyn Bath Referring Physician: Frazier Richards Treating Physician/Extender: BURNS III, Charlean Sanfilippo in Treatment: 1 Visit Information History Since Last Visit Added or deleted any medications: No Patient Arrived: Ambulatory Any new allergies or adverse reactions: No Arrival Time: 08:55 Had a fall or experienced change in No Accompanied By: wife activities of daily living that may affect Transfer Assistance: Manual risk of falls: Patient Identification Verified: Yes Signs or symptoms of abuse/neglect since last No Secondary Verification Process Yes visito Completed: Hospitalized since last visit: No Patient Has Alerts: Yes Pain Present Now: No Electronic Signature(s) Signed: 05/06/2015 4:07:53 PM By: Lorine Bears RCP, RRT, CHT Entered By: Becky Sax, Amado Nash on 05/06/2015 08:07:12 [place], [name] ([id number]) -------------------------------------------------------------------------------- Encounter Discharge Information Details Patient Name: [name] Date of Service: 05/06/2015 8:00 AM Medical Record Number: [id number] Patient Account Number: [id number] Date of Birth/Sex: 1920/05/19 (81 y.o. Male) Treating RN: Primary Care Physician: Frazier Richards Other Clinician: Jacqulyn Bath Referring Physician: Frazier Richards Treating Physician/Extender: BURNS III, Charlean Sanfilippo in Treatment: 1 Encounter Discharge Information Items Discharge Pain Level: 0 Discharge Condition: Stable Ambulatory Status: Ambulatory Discharge Destination: Home Private Transportation: Auto Accompanied By: wife Schedule Follow-up  Appointment: No Medication Reconciliation completed and No provided to Patient/Care Provider: Clinical Summary of Care: Notes Patient has an HBO treatment scheduled on 05/07/15 at 08:00 am. Electronic Signature(s) Signed: 05/06/2015 4:07:53 PM By: Lorine Bears RCP, RRT, CHT Entered By: Becky Sax, Amado Nash on 05/06/2015 10:35:45 Rufer, [name] ([id number]) -------------------------------------------------------------------------------- Vitals Details Patient Name: [name] Date of Service: 05/06/2015 8:00 AM Medical Record Number: [id number] Patient Account Number: [id number] Date of Birth/Sex: 12-21-1919 (81 y.o. Male) Treating RN: Primary Care Physician: Frazier Richards Other Clinician: Jacqulyn Bath Referring Physician: Frazier Richards Treating Physician/Extender: BURNS III, Charlean Sanfilippo in Treatment: 1 Vital Signs Time Taken: 07:55 Temperature (F): 97.8 Height (in): 67 Pulse (bpm): 54 Weight (lbs): 116.8 Respiratory Rate (breaths/min): 18 Body Mass Index (BMI): 18.3 Blood Pressure (mmHg): 128/70 Reference Range: 80 - 120 mg / dl Electronic Signature(s) Signed: 05/06/2015 4:07:53 PM By: Lorine Bears RCP, RRT, CHT Entered By: Becky Sax, Amado Nash on 05/06/2015 08:07:55

## 2015-05-07 NOTE — Progress Notes (Signed)
[name] ([id number]) Visit Report for 05/06/2015 HBO Details Patient Name: [name], [name]. Date of Service: 05/06/2015 8:00 AM Medical Record Number: [id number] Patient Account Number: [id number] Date of Birth/Sex: 06-30-1920 (81 y.o. Male) Treating RN: Primary Care Physician: [name] Other Clinician: Jacqulyn [name] Referring Physician: Frazier [name] Treating Physician/Extender: [name] in Treatment: 1 HBO Treatment Course Details Treatment Course Ordering Physician: [name] 1 Number: HBO Treatment Start Date: 05/05/2015 Total Treatments 40 Ordered: HBO Indication: Late Effect of Radiation HBO Treatment Details Treatment Number: 2 Patient Type: Outpatient Chamber Type: Monoplace Chamber #: IRS#[id number] Treatment Protocol: 2.0 ATA with 90 minutes oxygen, and no air breaks Treatment Details Compression Rate Down: 1.5 psi / minute De-Compression Rate Up: 1.5 psi / minute Air breaks and breathing Compress Tx Pressure Decompress Decompress periods Begins Reached Begins Ends (leave unused spaces blank) Chamber Pressure 1 ATA 2.0 ATA - - - - - - 2.0 ATA 1 ATA Clock Time (24 hr) 08:16 08:30 - - - - - - 10:01 10:11 Treatment Length: 115 (minutes) Treatment Segments: 4 Capillary Blood Glucose Pre Capillary Blood Glucose (mg/dl): Post Capillary Blood Glucose (mg/dl): Vital Signs Capillary Blood Glucose Reference Range: 80 - 120 mg / dl HBO Diabetic Blood Glucose Intervention Range: <131 mg/dl or >249 mg/dl Time Vitals Blood Respiratory Capillary Blood Glucose Pulse Action Type: Pulse: Temperature: Taken: Pressure: Rate: Glucose (mg/dl): Meter #: Oximetry (%) Taken: Pre 07:55 128/70 54 18 97.8 Post 10:15 124/78 60 18 97.6 Treatment Response Treatment Completion Status: Treatment Completed without Adverse Event [place], Berlie W. ([id number]) Treatment Notes Patient had slight barotrauma with both ears at 1.78 ATA. Pressure was leveled  off, then increased slowly with a decrease in rate set from 1.5 to 1.0. After continual yawning, jaw moving, etc. pain subsided and patient reached prescribed pressure with no more problems. HBO Attestation I certify that I supervised this HBO treatment in accordance with Medicare guidelines. A trained Yes emergency response team is readily available per hospital policies and procedures. Continue HBOT as ordered. Yes Electronic Signature(s) Signed: 05/06/2015 3:24:31 PM By: [name] Grayer MD Entered By: [name] on 05/06/2015 14:05:13 Toepfer, Colson W. ([id number]) -------------------------------------------------------------------------------- HBO Safety Checklist Details Patient Name: [name] Date of Service: 05/06/2015 8:00 AM Medical Record Number: [id number] Patient Account Number: [id number] Date of Birth/Sex: [birth year] (81 y.o. Male) Treating RN: Primary Care Physician: [name] Other Clinician: Jacqulyn [name] Referring Physician: Frazier [name] Treating Physician/Extender: [name] in Treatment: 1 HBO Safety Checklist Items Safety Checklist Consent Form Signed Patient voided / foley secured and emptied When did you last eato 05:00 am Last dose of injectable or oral agent n/a NA Ostomy pouch emptied and vented if applicable NA All implantable devices assessed, documented and approved NA Intravenous access site secured and place Valuables secured Linens and cotton and cotton/polyester blend (less than 51% polyester) Personal oil-based products / skin lotions / body lotions removed Wigs or hairpieces removed Smoking or tobacco materials removed Books / newspapers / magazines / loose paper removed Cologne, aftershave, perfume and deodorant removed Jewelry removed (may wrap wedding band) Make-up removed Hair care products removed Battery operated devices (external) removed Heating patches and chemical warmers removed NA  Titanium eyewear removed NA Nail polish cured greater than 10 hours NA Casting material cured greater than 10 hours Hearing aids removed Loose dentures or partials removed NA Prosthetics have been removed Patient demonstrates correct use of air break device (if applicable) Patient concerns have been  addressed Patient grounding bracelet on and cord attached to chamber Specifics for Inpatients (complete in addition to above) Medication sheet sent with patient Intravenous medications needed or due during therapy sent with patient MAEL, DELAP. ([id number]) Drainage tubes (e.g. nasogastric tube or chest tube secured and vented) Endotracheal or Tracheotomy tube secured Cuff deflated of air and inflated with saline Airway suctioned Electronic Signature(s) Signed: 05/06/2015 4:07:53 PM By: [name] RCP, RRT, CHT Entered By: [name], [name] on 05/06/2015 08:08:49

## 2015-05-07 NOTE — Progress Notes (Signed)
ARMONDO, CECH ([id number]) Visit Report for 05/07/2015 HBO Details Patient Name: [name], [name]. Date of Service: 05/07/2015 8:00 AM Medical Record Number: [id number] Patient Account Number: [id number] Date of Birth/Sex: 11-09-1919 (81 y.o. Male) Treating RN: Primary Care Physician: Frazier Richards Other Clinician: Jacqulyn Bath Referring Physician: Frazier Richards Treating Physician/Extender: Frann Rider in Treatment: 2 HBO Treatment Course Details Treatment Course Ordering Physician: Christin Fudge 1 Number: HBO Treatment Start Date: 05/05/2015 Total Treatments 40 Ordered: HBO Indication: Late Effect of Radiation HBO Treatment Details Treatment Number: 3 Patient Type: Outpatient Chamber Type: Monoplace Chamber #: HBO #[id number] Treatment Protocol: 2.0 ATA with 90 minutes oxygen, and no air breaks Treatment Details Compression Rate Down: 1.5 psi / minute De-Compression Rate Up: 1.5 psi / minute Air breaks and breathing Compress Tx Pressure Decompress Decompress periods Begins Reached Begins Ends (leave unused spaces blank) Chamber Pressure 1 ATA 2.0 ATA - - - - - - 2.0 ATA 1 ATA Clock Time (24 hr) 08:10 08:20 - - - - - - 09:51 10:01 Treatment Length: 111 (minutes) Treatment Segments: 4 Capillary Blood Glucose Pre Capillary Blood Glucose (mg/dl): Post Capillary Blood Glucose (mg/dl): Vital Signs Capillary Blood Glucose Reference Range: 80 - 120 mg / dl HBO Diabetic Blood Glucose Intervention Range: <131 mg/dl or >249 mg/dl Time Vitals Blood Respiratory Capillary Blood Glucose Pulse Action Type: Pulse: Temperature: Taken: Pressure: Rate: Glucose (mg/dl): Meter #: Oximetry (%) Taken: Pre 08:00 122/68 60 18 97.6 Post 10:15 124/72 60 18 97.6 Treatment Response Treatment Completion Status: Treatment Completed without Adverse Event Bhargava, Khayman W. ([id number]) HBO Attestation I certify that I supervised this HBO treatment in accordance with Medicare  guidelines. A trained Yes emergency response team is readily available per hospital policies and procedures. Continue HBOT as ordered. Yes Electronic Signature(s) Signed: 05/07/2015 10:58:12 AM By: Christin Fudge MD, FACS Entered By: Christin Fudge on 05/07/2015 10:58:12 [place]lers, [name] ([id number]) -------------------------------------------------------------------------------- HBO Safety Checklist Details Patient Name: [name] Date of Service: 05/07/2015 8:00 AM Medical Record Number: [id number] Patient Account Number: [id number] Date of Birth/Sex: [birth year] (81 y.o. Male) Treating RN: Primary Care Physician: Frazier Richards Other Clinician: Jacqulyn Bath Referring Physician: Frazier Richards Treating Physician/Extender: Frann Rider in Treatment: 2 HBO Safety Checklist Items Safety Checklist Consent Form Signed Patient voided / foley secured and emptied When did you last eato 06:30 Last dose of injectable or oral agent n/a NA Ostomy pouch emptied and vented if applicable NA All implantable devices assessed, documented and approved NA Intravenous access site secured and place Valuables secured Linens and cotton and cotton/polyester blend (less than 51% polyester) Personal oil-based products / skin lotions / body lotions removed Wigs or hairpieces removed Smoking or tobacco materials removed Books / newspapers / magazines / loose paper removed Cologne, aftershave, perfume and deodorant removed Jewelry removed (may wrap wedding band) Make-up removed Hair care products removed Battery operated devices (external) removed Heating patches and chemical warmers removed NA Titanium eyewear removed NA Nail polish cured greater than 10 hours NA Casting material cured greater than 10 hours Hearing aids removed Loose dentures or partials removed NA Prosthetics have been removed Patient demonstrates correct use of air break device (if applicable) Patient concerns  have been addressed Patient grounding bracelet on and cord attached to chamber Specifics for Inpatients (complete in addition to above) Medication sheet sent with patient Intravenous medications needed or due during therapy sent with patient JACEYON, STROLE. ([id number]) Drainage tubes (e.g. nasogastric tube or chest tube secured and vented) Endotracheal  or Tracheotomy tube secured Cuff deflated of air and inflated with saline Airway suctioned Electronic Signature(s) Signed: 05/07/2015 1:15:01 PM By: Lorine Bears RCP, RRT, CHT Entered By: Becky Sax, Amado Nash on 05/07/2015 08:21:27

## 2015-05-07 NOTE — Progress Notes (Signed)
[name], [name] ([id number]) Visit Report for 05/07/2015 Arrival Information Details Patient Name: [name], [name] Date of Service: 05/07/2015 8:00 AM Medical Record Number: [id number] Patient Account Number: [id number] Date of Birth/Sex: 1920-04-25 (81 y.o. Male) Treating RN: Primary Care Physician: Frazier Richards Other Clinician: Jacqulyn Bath Referring Physician: Frazier Richards Treating Physician/Extender: Frann Rider in Treatment: 2 Visit Information History Since Last Visit Added or deleted any medications: No Patient Arrived: Ambulatory Any new allergies or adverse reactions: No Arrival Time: 07:50 Had a fall or experienced change in No Accompanied By: wife activities of daily living that may affect Transfer Assistance: None risk of falls: Patient Identification Verified: Yes Signs or symptoms of abuse/neglect since last No Secondary Verification Process Yes visito Completed: Hospitalized since last visit: No Patient Has Alerts: Yes Pain Present Now: No Electronic Signature(s) Signed: 05/07/2015 1:15:01 PM By: Lorine Bears RCP, RRT, CHT Entered By: Becky Sax, Amado Nash on 05/07/2015 08:20:01 Higashi, [name] ([id number]) -------------------------------------------------------------------------------- Encounter Discharge Information Details Patient Name: [name] Date of Service: 05/07/2015 8:00 AM Medical Record Number: [id number] Patient Account Number: [id number] Date of Birth/Sex: [birth year] (81 y.o. Male) Treating RN: Primary Care Physician: Frazier Richards Other Clinician: Jacqulyn Bath Referring Physician: Frazier Richards Treating Physician/Extender: Frann Rider in Treatment: 2 Encounter Discharge Information Items Discharge Pain Level: 0 Discharge Condition: Stable Ambulatory Status: Ambulatory Discharge Destination: Home Private Transportation: Auto Accompanied By: wife Schedule Follow-up  Appointment: No Medication Reconciliation completed and No provided to Patient/Care Jonella Redditt: Clinical Summary of Care: Notes Patient has an HBO treatment scheduled on 05/08/15 at 08:00 am. Electronic Signature(s) Signed: 05/07/2015 1:15:01 PM By: Lorine Bears RCP, RRT, CHT Entered By: Becky Sax, Amado Nash on 05/07/2015 10:36:57 [name], [name] ([id number]) -------------------------------------------------------------------------------- Vitals Details Patient Name: [name] Date of Service: 05/07/2015 8:00 AM Medical Record Number: [id number] Patient Account Number: [id number] Date of Birth/Sex: 1920-05-02 (81 y.o. Male) Treating RN: Primary Care Physician: Frazier Richards Other Clinician: Jacqulyn Bath Referring Physician: Frazier Richards Treating Physician/Extender: Frann Rider in Treatment: 2 Vital Signs Time Taken: 08:00 Temperature (F): 97.6 Height (in): 67 Pulse (bpm): 60 Weight (lbs): 116.8 Respiratory Rate (breaths/min): 18 Body Mass Index (BMI): 18.3 Blood Pressure (mmHg): 122/68 Reference Range: 80 - 120 mg / dl Electronic Signature(s) Signed: 05/07/2015 1:15:01 PM By: Lorine Bears RCP, RRT, CHT Entered By: Becky Sax, Amado Nash on 05/07/2015 08:20:25

## 2015-05-08 ENCOUNTER — Encounter: Payer: Medicare Other | Admitting: Surgery

## 2015-05-08 DIAGNOSIS — N3041 Irradiation cystitis with hematuria: Secondary | ICD-10-CM | POA: Diagnosis not present

## 2015-05-08 NOTE — Progress Notes (Signed)
LANDRUM, CARBONELL ([id number]) Visit Report for 05/08/2015 Arrival Information Details Patient Name: [name], [name] Date of Service: 05/08/2015 8:00 AM Medical Record Number: [id number] Patient Account Number: [id number] Date of Birth/Sex: 10-02-1919 (81 y.o. Male) Treating RN: Cornell Barman Primary Care Physician: Frazier Richards Other Clinician: Jacqulyn Bath Referring Physician: Frazier Richards Treating Physician/Extender: Frann Rider in Treatment: 2 Visit Information History Since Last Visit Added or deleted any medications: No Patient Arrived: Ambulatory Any new allergies or adverse reactions: No Arrival Time: 08:00 Had a fall or experienced change in No Accompanied By: wife activities of daily living that may affect Transfer Assistance: None risk of falls: Patient Identification Verified: Yes Signs or symptoms of abuse/neglect since last No Secondary Verification Process Yes visito Completed: Hospitalized since last visit: No Patient Has Alerts: Yes Pain Present Now: No Electronic Signature(s) Signed: 05/08/2015 8:23:37 AM By: Gretta Cool, RN, BSN, Kim RN, BSN Entered By: Gretta Cool, RN, BSN, Kim on 05/08/2015 08:23:37 [name] ([id number]) -------------------------------------------------------------------------------- Encounter Discharge Information Details Patient Name: [name] Date of Service: 05/08/2015 8:00 AM Medical Record Number: [id number] Patient Account Number: [id number] Date of Birth/Sex: 1920-05-12 (81 y.o. Male) Treating RN: Cornell Barman Primary Care Physician: Frazier Richards Other Clinician: Jacqulyn Bath Referring Physician: Frazier Richards Treating Physician/Extender: Frann Rider in Treatment: 2 Encounter Discharge Information Items Discharge Pain Level: 0 Discharge Condition: Stable Ambulatory Status: Ambulatory Discharge Destination: Home Private Transportation: Auto Accompanied By: wife Schedule Follow-up Appointment:  Yes Medication Reconciliation completed and Yes provided to Patient/Care Curley Fayette: Clinical Summary of Care: Electronic Signature(s) Signed: 05/08/2015 11:18:09 AM By: Gretta Cool, RN, BSN, Kim RN, BSN Entered By: Gretta Cool, RN, BSN, Kim on 05/08/2015 11:18:09 [name] ([id number]) -------------------------------------------------------------------------------- Vitals Details Patient Name: [name] Date of Service: 05/08/2015 8:00 AM Medical Record Number: [id number] Patient Account Number: [id number] Date of Birth/Sex: [birth year] (81 y.o. Male) Treating RN: Cornell Barman Primary Care Physician: Frazier Richards Other Clinician: Jacqulyn Bath Referring Physician: Frazier Richards Treating Physician/Extender: Frann Rider in Treatment: 2 Vital Signs Time Taken: 08:12 Temperature (F): 97.5 Height (in): 67 Pulse (bpm): 68 Weight (lbs): 116.8 Respiratory Rate (breaths/min): 18 Body Mass Index (BMI): 18.3 Blood Pressure (mmHg): 102/78 Reference Range: 80 - 120 mg / dl Electronic Signature(s) Signed: 05/08/2015 8:24:06 AM By: Gretta Cool, RN, BSN, Kim RN, BSN Entered By: Gretta Cool, RN, BSN, Kim on 05/08/2015 08:24:06

## 2015-05-08 NOTE — Progress Notes (Addendum)
[name], [name] ([id number]) Visit Report for 05/08/2015 HBO Details Patient Name: [name], [name] Date of Service: 05/08/2015 8:00 AM Medical Record Number: [id number] Patient Account Number: [id number] Date of Birth/Sex: [birth year] (81 y.o. Male) Treating RN: Cornell Barman Primary Care Physician: Frazier Richards Other Clinician: Jacqulyn Bath Referring Physician: Frazier Richards Treating Physician/Extender: Frann Rider in Treatment: 2 HBO Treatment Course Details Treatment Course Ordering Physician: Christin Fudge 1 Number: HBO Treatment Start Date: 05/05/2015 Total Treatments 40 Ordered: HBO Indication: Late Effect of Radiation HBO Treatment Details Treatment Number: 4 Patient Type: Outpatient Chamber Type: Monoplace Chamber #: HBO #[id number] Treatment Protocol: 2.0 ATA with 90 minutes oxygen, and no air breaks Treatment Details Compression Rate Down: 1.5 psi / minute De-Compression Rate Up: 1.5 psi / minute Air breaks and breathing Compress Tx Pressure Decompress Decompress periods Begins Reached Begins Ends (leave unused spaces blank) Chamber Pressure 1 ATA 2.0 ATA - - - - - - 2.0 ATA 1 ATA Clock Time (24 hr) 08:16 08:25 - - - - - - 10:08 10:20 Treatment Length: 124 (minutes) Treatment Segments: 4 Capillary Blood Glucose Pre Capillary Blood Glucose (mg/dl): Post Capillary Blood Glucose (mg/dl): Vital Signs Capillary Blood Glucose Reference Range: 80 - 120 mg / dl HBO Diabetic Blood Glucose Intervention Range: <131 mg/dl or >249 mg/dl Time Vitals Blood Respiratory Capillary Blood Glucose Pulse Action Type: Pulse: Temperature: Taken: Pressure: Rate: Glucose (mg/dl): Meter #: Oximetry (%) Taken: Pre 08:12 102/78 68 18 97.5 Post 10:35 110/72 68 18 96.9 Treatment Response Treatment Well Toleration: [name] ([id number]) Treatment Treatment Completed without Adverse Event Completion Status: HBO Attestation I certify that I supervised this HBO  treatment in accordance with Medicare guidelines. A trained Yes emergency response team is readily available per hospital policies and procedures. Continue HBOT as ordered. Yes Electronic Signature(s) Signed: 05/08/2015 12:20:18 PM By: Gretta Cool, RN, BSN, Kim RN, BSN Signed: 05/08/2015 4:17:16 PM By: Christin Fudge MD, FACS Previous Signature: 05/08/2015 11:06:00 AM Version By: Christin Fudge MD, FACS Previous Signature: 05/08/2015 10:37:39 AM Version By: Gretta Cool RN, BSN, Kim RN, BSN Previous Signature: 05/08/2015 8:25:46 AM Version By: Gretta Cool, RN, BSN, Kim RN, BSN Entered By: Gretta Cool, RN, BSN, Kim on 05/08/2015 12:20:18 [name] ([id number]) -------------------------------------------------------------------------------- HBO Safety Checklist Details Patient Name: [name], [name] Date of Service: 05/08/2015 8:00 AM Medical Record Number: [id number] Patient Account Number: [id number] Date of Birth/Sex: 30-Jan-1920 (81 y.o. Male) Treating RN: Cornell Barman Primary Care Physician: Frazier Richards Other Clinician: Referring Physician: Frazier Richards Treating Physician/Extender: Frann Rider in Treatment: 2 HBO Safety Checklist Items Safety Checklist Consent Form Signed Patient voided / foley secured and emptied When did you last eato 0715 Last dose of injectable or oral agent NA Ostomy pouch emptied and vented if applicable NA All implantable devices assessed, documented and approved NA Intravenous access site secured and place Valuables secured Linens and cotton and cotton/polyester blend (less than 51% polyester) NA Personal oil-based products / skin lotions / body lotions removed NA Wigs or hairpieces removed NA Smoking or tobacco materials removed NA Books / newspapers / magazines / loose paper removed NA Cologne, aftershave, perfume and deodorant removed Jewelry removed (may wrap wedding band) NA Make-up removed NA Hair care products removed NA Battery operated devices  (external) removed NA Heating patches and chemical warmers removed NA Titanium eyewear removed NA Nail polish cured greater than 10 hours NA Casting material cured greater than 10 hours Hearing aids removed Loose dentures or partials removed NA Prosthetics have been removed  Patient demonstrates correct use of air break device (if applicable) Patient concerns have been addressed Patient grounding bracelet on and cord attached to chamber Specifics for Inpatients (complete in addition to above) Medication sheet sent with patient Intravenous medications needed or due during therapy sent with patient [name], [name]. ([id number]) Drainage tubes (e.g. nasogastric tube or chest tube secured and vented) Endotracheal or Tracheotomy tube secured Cuff deflated of air and inflated with saline Airway suctioned Electronic Signature(s) Signed: 05/08/2015 8:25:25 AM By: Gretta Cool, RN, BSN, Kim RN, BSN Entered By: Gretta Cool, RN, BSN, Kim on 05/08/2015 91:91:66

## 2015-05-11 ENCOUNTER — Encounter: Payer: Medicare Other | Attending: Surgery | Admitting: Surgery

## 2015-05-11 DIAGNOSIS — D075 Carcinoma in situ of prostate: Secondary | ICD-10-CM | POA: Diagnosis not present

## 2015-05-11 DIAGNOSIS — N3041 Irradiation cystitis with hematuria: Secondary | ICD-10-CM | POA: Diagnosis present

## 2015-05-11 DIAGNOSIS — G4730 Sleep apnea, unspecified: Secondary | ICD-10-CM | POA: Diagnosis not present

## 2015-05-11 NOTE — Progress Notes (Signed)
[name], [name] ([id number]) Visit Report for 05/11/2015 Arrival Information Details Patient Name: [name], [name] Date of Service: 05/11/2015 10:00 AM Medical Record Number: [id number] Patient Account Number: [id number] Date of Birth/Sex: 1919/09/07 (81 y.o. Male) Treating RN: Primary Care Physician: Frazier Richards Other Clinician: Jacqulyn Bath Referring Physician: Frazier Richards Treating Physician/Extender: Frann Rider in Treatment: 2 Visit Information History Since Last Visit Added or deleted any medications: No Patient Arrived: Ambulatory Any new allergies or adverse reactions: No Arrival Time: 09:20 Had a fall or experienced change in No Accompanied By: wife activities of daily living that may affect Transfer Assistance: None risk of falls: Patient Identification Verified: Yes Hospitalized since last visit: No Secondary Verification Process Yes Pain Present Now: No Completed: Patient Has Alerts: Yes Electronic Signature(s) Signed: 05/11/2015 1:57:39 PM By: Lorine Bears RCP, RRT, CHT Entered By: Lorine Bears on 05/11/2015 10:02:19 [name] ([id number]) -------------------------------------------------------------------------------- Encounter Discharge Information Details Patient Name: [name] Date of Service: 05/11/2015 10:00 AM Medical Record Number: [id number] Patient Account Number: [id number] Date of Birth/Sex: [birth year] (81 y.o. Male) Treating RN: Primary Care Physician: Frazier Richards Other Clinician: Jacqulyn Bath Referring Physician: Frazier Richards Treating Physician/Extender: Frann Rider in Treatment: 2 Encounter Discharge Information Items Discharge Pain Level: 0 Discharge Condition: Stable Ambulatory Status: Ambulatory Discharge Destination: Home Private Transportation: Auto Accompanied By: wife Schedule Follow-up Appointment: No Medication Reconciliation completed  and No provided to Patient/Care Moet Mikulski: Clinical Summary of Care: Notes Patient has an HBO treatment scheduled on 05/12/15 at 10:00 am. Electronic Signature(s) Signed: 05/11/2015 1:57:39 PM By: Lorine Bears RCP, RRT, CHT Entered By: Becky Sax, Amado Nash on 05/11/2015 11:49:02 [name] ([id number]) -------------------------------------------------------------------------------- Vitals Details Patient Name: [name] Date of Service: 05/11/2015 10:00 AM Medical Record Number: [id number] Patient Account Number: [id number] Date of Birth/Sex: Mar 13, 1920 (81 y.o. Male) Treating RN: Primary Care Physician: Frazier Richards Other Clinician: Jacqulyn Bath Referring Physician: Frazier Richards Treating Physician/Extender: Frann Rider in Treatment: 2 Vital Signs Time Taken: 09:20 Temperature (F): 97.0 Height (in): 67 Pulse (bpm): 66 Weight (lbs): 116.8 Respiratory Rate (breaths/min): 18 Body Mass Index (BMI): 18.3 Blood Pressure (mmHg): 102/70 Reference Range: 80 - 120 mg / dl Electronic Signature(s) Signed: 05/11/2015 1:57:39 PM By: Lorine Bears RCP, RRT, CHT Entered By: Lorine Bears on 05/11/2015 10:02:46

## 2015-05-11 NOTE — Progress Notes (Signed)
TRAVEZ, [name] ([id number]) Visit Report for 05/11/2015 HBO Details Patient Name: [name], [name] Date of Service: 05/11/2015 10:00 AM Medical Record Number: [id number] Patient Account Number: [id number] Date of Birth/Sex: [birth year] (81 y.o. Male) Treating RN: Primary Care Physician: Frazier Richards Other Clinician: Jacqulyn Bath Referring Physician: Frazier Richards Treating Physician/Extender: Frann Rider in Treatment: 2 HBO Treatment Course Details Treatment Course Ordering Physician: Christin Fudge 1 Number: HBO Treatment Start Date: 05/05/2015 Total Treatments 40 Ordered: HBO Indication: Late Effect of Radiation HBO Treatment Details Treatment Number: 5 Patient Type: Outpatient Chamber Type: Monoplace Chamber #: [id number]#[id number] Treatment Protocol: 2.0 ATA with 90 minutes oxygen, and no air breaks Treatment Details Compression Rate Down: 1.5 psi / minute De-Compression Rate Up: 1.5 psi / minute Air breaks and breathing Compress Tx Pressure Decompress Decompress periods Begins Reached Begins Ends (leave unused spaces blank) Chamber Pressure 1 ATA 2.0 ATA - - - - - - 2.0 ATA 1 ATA Clock Time (24 hr) 09:44 09:54 - - - - - - 11:25 11:35 Treatment Length: 111 (minutes) Treatment Segments: 4 Capillary Blood Glucose Pre Capillary Blood Glucose (mg/dl): Post Capillary Blood Glucose (mg/dl): Vital Signs Capillary Blood Glucose Reference Range: 80 - 120 mg / dl HBO Diabetic Blood Glucose Intervention Range: <131 mg/dl or >249 mg/dl Time Vitals Blood Respiratory Capillary Blood Glucose Pulse Action Type: Pulse: Temperature: Taken: Pressure: Rate: Glucose (mg/dl): Meter #: Oximetry (%) Taken: Pre 09:20 102/70 66 18 97 Post 11:37 120/64 48 18 97 Treatment Response Treatment Completion Status: Treatment Completed without Adverse Event Kassim, Tyjuan W. ([id number]) HBO Attestation I certify that I supervised this HBO treatment in accordance with Medicare  guidelines. A trained Yes emergency response team is readily available per hospital policies and procedures. Continue HBOT as ordered. Yes Electronic Signature(s) Signed: 05/11/2015 12:24:32 PM By: Christin Fudge MD, FACS Entered By: Christin Fudge on 05/11/2015 12:24:32 Keel, [name] ([id number]) -------------------------------------------------------------------------------- HBO Safety Checklist Details Patient Name: [name] Date of Service: 05/11/2015 10:00 AM Medical Record Number: [id number] Patient Account Number: [id number] Date of Birth/Sex: [birth year] (81 y.o. Male) Treating RN: Primary Care Physician: Frazier Richards Other Clinician: Jacqulyn Bath Referring Physician: Frazier Richards Treating Physician/Extender: Frann Rider in Treatment: 2 HBO Safety Checklist Items Safety Checklist Consent Form Signed Patient voided / foley secured and emptied When did you last eato 06:30 am Last dose of injectable or oral agent n/a NA Ostomy pouch emptied and vented if applicable NA All implantable devices assessed, documented and approved NA Intravenous access site secured and place Valuables secured Linens and cotton and cotton/polyester blend (less than 51% polyester) Personal oil-based products / skin lotions / body lotions removed Wigs or hairpieces removed Smoking or tobacco materials removed Books / newspapers / magazines / loose paper removed Cologne, aftershave, perfume and deodorant removed Jewelry removed (may wrap wedding band) Make-up removed Hair care products removed Battery operated devices (external) removed Heating patches and chemical warmers removed NA Titanium eyewear removed NA Nail polish cured greater than 10 hours NA Casting material cured greater than 10 hours Hearing aids removed Loose dentures or partials removed NA Prosthetics have been removed Patient demonstrates correct use of air break device (if applicable) Patient  concerns have been addressed Patient grounding bracelet on and cord attached to chamber Specifics for Inpatients (complete in addition to above) Medication sheet sent with patient Intravenous medications needed or due during therapy sent with patient JOURDYN, FERRIN. ([id number]) Drainage tubes (e.g. nasogastric tube or chest tube secured and vented) Endotracheal  or Tracheotomy tube secured Cuff deflated of air and inflated with saline Airway suctioned Electronic Signature(s) Signed: 05/11/2015 1:57:39 PM By: Lorine Bears RCP, RRT, CHT Entered By: Lorine Bears on 05/11/2015 10:03:31

## 2015-05-12 ENCOUNTER — Encounter: Payer: Medicare Other | Admitting: Surgery

## 2015-05-13 ENCOUNTER — Encounter: Payer: Medicare Other | Admitting: Surgery

## 2015-05-13 DIAGNOSIS — N3041 Irradiation cystitis with hematuria: Secondary | ICD-10-CM | POA: Diagnosis not present

## 2015-05-13 NOTE — Progress Notes (Signed)
[name], [name] ([id number]) Visit Report for 05/13/2015 Arrival Information Details Patient Name: [name], [name] Date of Service: 05/13/2015 10:00 AM Medical Record Number: [id number] Patient Account Number: [id number] Date of Birth/Sex: 1920-04-04 (81 y.o. Male) Treating RN: Primary Care Physician: Frazier Richards Other Clinician: Jacqulyn Bath Referring Physician: Frazier Richards Treating Physician/Extender: BURNS III, Charlean Sanfilippo in Treatment: 2 Visit Information History Since Last Visit Added or deleted any medications: No Patient Arrived: Ambulatory Any new allergies or adverse reactions: No Arrival Time: 09:35 Had a fall or experienced change in No Accompanied By: self activities of daily living that may affect Transfer Assistance: None risk of falls: Patient Identification Verified: Yes Signs or symptoms of abuse/neglect since last No Secondary Verification Process Yes visito Completed: Hospitalized since last visit: No Patient Has Alerts: Yes Pain Present Now: No Electronic Signature(s) Signed: 05/13/2015 9:10:02 AM By: Lorine Bears RCP, RRT, CHT Entered By: Lorine Bears on 05/13/2015 10:29:23 [name] ([id number]) -------------------------------------------------------------------------------- Encounter Discharge Information Details Patient Name: [name] Date of Service: 05/13/2015 10:00 AM Medical Record Number: [id number] Patient Account Number: [id number] Date of Birth/Sex: 01/04/1920 (81 y.o. Male) Treating RN: Primary Care Physician: Frazier Richards Other Clinician: Jacqulyn Bath Referring Physician: Frazier Richards Treating Physician/Extender: BURNS III, Charlean Sanfilippo in Treatment: 2 Encounter Discharge Information Items Discharge Pain Level: 0 Discharge Condition: Stable Ambulatory Status: Wheelchair Discharge Destination: Home Private Transportation: Auto Accompanied By: self Schedule Follow-up  Appointment: No Medication Reconciliation completed and No provided to Patient/Care [name]: Clinical Summary of Care: Notes Patient has an HBO treatment scheduled on 05/14/15 at 10:00 am. Electronic Signature(s) Signed: 05/13/2015 9:10:02 AM By: Lorine Bears RCP, RRT, CHT Entered By: Lorine Bears on 05/13/2015 12:07:31 [name], [name] ([id number]) -------------------------------------------------------------------------------- Vitals Details Patient Name: [name] Date of Service: 05/13/2015 10:00 AM Medical Record Number: [id number] Patient Account Number: [id number] Date of Birth/Sex: Dec 22, 1919 (81 y.o. Male) Treating RN: Primary Care Physician: Frazier Richards Other Clinician: Jacqulyn Bath Referring Physician: Frazier Richards Treating Physician/Extender: BURNS III, Charlean Sanfilippo in Treatment: 2 Vital Signs Time Taken: 09:35 Temperature (F): 97.6 Height (in): 67 Pulse (bpm): 48 Weight (lbs): 116.8 Respiratory Rate (breaths/min): 18 Body Mass Index (BMI): 18.3 Blood Pressure (mmHg): 112/78 Reference Range: 80 - 120 mg / dl Electronic Signature(s) Signed: 05/13/2015 9:10:02 AM By: Lorine Bears RCP, RRT, CHT Entered By: Becky Sax, Amado Nash on 05/13/2015 10:29:52

## 2015-05-14 ENCOUNTER — Encounter: Payer: Self-pay | Admitting: General Surgery

## 2015-05-14 ENCOUNTER — Encounter (HOSPITAL_BASED_OUTPATIENT_CLINIC_OR_DEPARTMENT_OTHER): Payer: Medicare Other | Admitting: General Surgery

## 2015-05-14 DIAGNOSIS — L599 Disorder of the skin and subcutaneous tissue related to radiation, unspecified: Secondary | ICD-10-CM | POA: Diagnosis not present

## 2015-05-14 DIAGNOSIS — N3041 Irradiation cystitis with hematuria: Secondary | ICD-10-CM | POA: Diagnosis not present

## 2015-05-14 NOTE — Progress Notes (Signed)
seeiheal 

## 2015-05-14 NOTE — Progress Notes (Addendum)
MOHMMAD, [name] ([id number]) Visit Report for 05/13/2015 HBO Details Patient Name: [name], [name] Date of Service: 05/13/2015 10:00 AM Medical Record Number: [id number] Patient Account Number: [id number] Date of Birth/Sex: 05-15-1920 (81 y.o. Male) Treating RN: Primary Care Physician: Frazier Richards Other Clinician: Jacqulyn Bath Referring Physician: Frazier Richards Treating Physician/Extender: BURNS III, Charlean Sanfilippo in Treatment: 2 HBO Treatment Course Details Treatment Course Ordering Physician: Christin Fudge 1 Number: HBO Treatment Start Date: 05/05/2015 Total Treatments 40 Ordered: HBO Indication: Late Effect of Radiation HBO Treatment Details Treatment Number: 6 Patient Type: Outpatient Chamber Type: Monoplace Chamber #: HBO #[id number] Treatment Protocol: 2.0 ATA with 90 minutes oxygen, and no air breaks Treatment Details Compression Rate Down: 1.5 psi / minute De-Compression Rate Up: 1.5 psi / minute Air breaks and breathing Compress Tx Pressure Decompress Decompress periods Begins Reached Begins Ends (leave unused spaces blank) Chamber Pressure 1 ATA 2.0 ATA - - - - - - 2.0 ATA 1 ATA Clock Time (24 hr) 09:51 10:01 - - - - - - 11:31 11:41 Treatment Length: 110 (minutes) Treatment Segments: 4 Capillary Blood Glucose Pre Capillary Blood Glucose (mg/dl): Post Capillary Blood Glucose (mg/dl): Vital Signs Capillary Blood Glucose Reference Range: 80 - 120 mg / dl HBO Diabetic Blood Glucose Intervention Range: <131 mg/dl or >249 mg/dl Time Vitals Blood Respiratory Capillary Blood Glucose Pulse Action Type: Pulse: Temperature: Taken: Pressure: Rate: Glucose (mg/dl): Meter #: Oximetry (%) Taken: Pre 09:35 112/78 48 18 97.6 Post 11:45 116/68 54 18 97 Treatment Response Treatment Completion Status: Treatment Completed without Adverse Event [name], Sebert W. ([id number]) HBO Attestation I certify that I supervised this HBO treatment in accordance with Medicare  guidelines. A trained Yes emergency response team is readily available per hospital policies and procedures. Continue HBOT as ordered. Yes Electronic Signature(s) Signed: 05/14/2015 8:27:30 AM By: Loletha Grayer MD Previous Signature: 05/13/2015 4:13:26 PM Version By: Loletha Grayer MD Previous Signature: 05/13/2015 9:10:02 AM Version By: Lorine Bears RCP, RRT, CHT Entered By: Loletha Grayer on 05/14/2015 08:25:58 Garside, [name] ([id number]) -------------------------------------------------------------------------------- HBO Safety Checklist Details Patient Name: [name] Date of Service: 05/13/2015 10:00 AM Medical Record Number: [id number] Patient Account Number: [id number] Date of Birth/Sex: September 04, 1919 (81 y.o. Male) Treating RN: Primary Care Physician: Frazier Richards Other Clinician: Jacqulyn Bath Referring Physician: Frazier Richards Treating Physician/Extender: BURNS III, Charlean Sanfilippo in Treatment: 2 HBO Safety Checklist Items Safety Checklist Consent Form Signed Patient voided / foley secured and emptied When did you last eato 07:00 am Last dose of injectable or oral agent n/a NA Ostomy pouch emptied and vented if applicable NA All implantable devices assessed, documented and approved NA Intravenous access site secured and place Valuables secured Linens and cotton and cotton/polyester blend (less than 51% polyester) Personal oil-based products / skin lotions / body lotions removed Wigs or hairpieces removed Smoking or tobacco materials removed Books / newspapers / magazines / loose paper removed Cologne, aftershave, perfume and deodorant removed Jewelry removed (may wrap wedding band) Make-up removed Hair care products removed Battery operated devices (external) removed Heating patches and chemical warmers removed NA Titanium eyewear removed NA Nail polish cured greater than 10 hours NA Casting material cured greater than 10  hours Hearing aids removed Loose dentures or partials removed NA Prosthetics have been removed Patient demonstrates correct use of air break device (if applicable) Patient concerns have been addressed Patient grounding bracelet on and cord attached to chamber Specifics for Inpatients (complete in addition to above) Medication sheet sent  with patient Intravenous medications needed or due during therapy sent with patient JAHAD, OLD. ([id number]) Drainage tubes (e.g. nasogastric tube or chest tube secured and vented) Endotracheal or Tracheotomy tube secured Cuff deflated of air and inflated with saline Airway suctioned Electronic Signature(s) Signed: 05/13/2015 9:10:02 AM By: Lorine Bears RCP, RRT, CHT Entered By: Lorine Bears on 05/13/2015 10:30:33

## 2015-05-14 NOTE — Progress Notes (Signed)
[name], [name] ([id number]) Visit Report for 05/14/2015 Arrival Information Details Patient Name: [name], [name] Date of Service: 05/14/2015 10:00 AM Medical Record Number: [id number] Patient Account Number: [id number] Date of Birth/Sex: 06-12-1920 (81 y.o. Male) Treating RN: Primary Care Physician: Frazier Richards Other Clinician: Jacqulyn Bath Referring Physician: Frazier Richards Treating Physician/Extender: Frann Rider in Treatment: 3 Visit Information History Since Last Visit Added or deleted any medications: No Patient Arrived: Ambulatory Any new allergies or adverse reactions: No Arrival Time: 09:30 Had a fall or experienced change in No Accompanied By: wife activities of daily living that may affect Transfer Assistance: None risk of falls: Patient Identification Verified: Yes Signs or symptoms of abuse/neglect since last No Secondary Verification Process Yes visito Completed: Hospitalized since last visit: No Patient Has Alerts: Yes Pain Present Now: No Electronic Signature(s) Signed: 05/14/2015 2:36:08 PM By: Lorine Bears RCP, RRT, CHT Entered By: Becky Sax, Amado Nash on 05/14/2015 09:37:57 [name], [name] ([id number]) -------------------------------------------------------------------------------- Encounter Discharge Information Details Patient Name: [name] Date of Service: 05/14/2015 10:00 AM Medical Record Number: [id number] Patient Account Number: [id number] Date of Birth/Sex: 1920/01/29 (81 y.o. Male) Treating RN: Primary Care Physician: Frazier Richards Other Clinician: Jacqulyn Bath Referring Physician: Frazier Richards Treating Physician/Extender: Benjaman Pott in Treatment: 3 Encounter Discharge Information Items Discharge Pain Level: 0 Discharge Condition: Stable Ambulatory Status: Ambulatory Discharge Destination: Home Private Transportation: Auto Accompanied By: wife Schedule Follow-up  Appointment: No Medication Reconciliation completed and No provided to Patient/Care [name]: Clinical Summary of Care: Notes Patient has an HBO treatment scheduled on 05/18/15 at 10:00 am. Electronic Signature(s) Signed: 05/14/2015 2:36:08 PM By: Lorine Bears RCP, RRT, CHT Entered By: Lorine Bears on 05/14/2015 12:27:04 [name] ([id number]) -------------------------------------------------------------------------------- Vitals Details Patient Name: [name] Date of Service: 05/14/2015 10:00 AM Medical Record Number: [id number] Patient Account Number: [id number] Date of Birth/Sex: 06/28/1920 (81 y.o. Male) Treating RN: Primary Care Physician: Frazier Richards Other Clinician: Jacqulyn Bath Referring Physician: Frazier Richards Treating Physician/Extender: Frann Rider in Treatment: 3 Vital Signs Time Taken: 09:45 Temperature (F): 97.5 Height (in): 67 Pulse (bpm): 54 Weight (lbs): 116.8 Respiratory Rate (breaths/min): 18 Body Mass Index (BMI): 18.3 Blood Pressure (mmHg): 122/64 Reference Range: 80 - 120 mg / dl Electronic Signature(s) Signed: 05/14/2015 2:36:08 PM By: Lorine Bears RCP, RRT, CHT Entered By: Becky Sax, Amado Nash on 05/14/2015 09:50:48

## 2015-05-15 ENCOUNTER — Encounter: Payer: Medicare Other | Admitting: Surgery

## 2015-05-18 ENCOUNTER — Encounter (HOSPITAL_BASED_OUTPATIENT_CLINIC_OR_DEPARTMENT_OTHER): Payer: Medicare Other | Admitting: General Surgery

## 2015-05-18 DIAGNOSIS — N3041 Irradiation cystitis with hematuria: Secondary | ICD-10-CM | POA: Diagnosis not present

## 2015-05-18 DIAGNOSIS — L598 Other specified disorders of the skin and subcutaneous tissue related to radiation: Secondary | ICD-10-CM | POA: Insufficient documentation

## 2015-05-18 DIAGNOSIS — L599 Disorder of the skin and subcutaneous tissue related to radiation, unspecified: Secondary | ICD-10-CM | POA: Diagnosis not present

## 2015-05-18 NOTE — Progress Notes (Signed)
seeiheal 

## 2015-05-18 NOTE — Progress Notes (Addendum)
[name], [name] ([id number]) Visit Report for 05/18/2015 Arrival Information Details Patient Name: [name], [name] Date of Service: 05/18/2015 10:00 AM Medical Record Number: [id number] Patient Account Number: [id number] Date of Birth/Sex: 08/28/1919 (81 y.o. Male) Treating RN: Primary Care Physician: Frazier Richards Other Clinician: Jacqulyn Bath Referring Physician: Frazier Richards Treating Physician/Extender: Benjaman Pott in Treatment: 3 Visit Information History Since Last Visit Added or deleted any medications: No Patient Arrived: Ambulatory Any new allergies or adverse reactions: No Arrival Time: 09:40 Had a fall or experienced change in No Accompanied By: wife activities of daily living that may affect Transfer Assistance: Manual risk of falls: Patient Identification Verified: Yes Signs or symptoms of abuse/neglect since last No Secondary Verification Process Yes visito Completed: Hospitalized since last visit: No Patient Has Alerts: Yes Pain Present Now: No Electronic Signature(s) Signed: 05/18/2015 12:18:30 PM By: Lorine Bears RCP, RRT, CHT Entered By: Lorine Bears on 05/18/2015 10:02:34 Ehrsam, [name] ([id number]) -------------------------------------------------------------------------------- Encounter Discharge Information Details Patient Name: [name] Date of Service: 05/18/2015 10:00 AM Medical Record Number: [id number] Patient Account Number: [id number] Date of Birth/Sex: 1920/02/11 (81 y.o. Male) Treating RN: Primary Care Physician: Frazier Richards Other Clinician: Jacqulyn Bath Referring Physician: Frazier Richards Treating Physician/Extender: Benjaman Pott in Treatment: 3 Encounter Discharge Information Items Discharge Pain Level: 0 Discharge Condition: Stable Ambulatory Status: Ambulatory Discharge Destination: Home Private Transportation: Auto Accompanied By: wife Schedule Follow-up  Appointment: No Medication Reconciliation completed and No provided to Patient/Care Ai Sonnenfeld: Clinical Summary of Care: Notes Patient has an HBO treatment scheduled on 05/19/15 at 10:00 am. Electronic Signature(s) Signed: 05/18/2015 5:36:04 PM By: Judene Companion MD Previous Signature: 05/18/2015 12:18:30 PM Version By: Lorine Bears RCP, RRT, CHT Entered By: Judene Companion on 05/18/2015 13:18:27 Hollomon, Markeise Viona Gilmore ([id number]) -------------------------------------------------------------------------------- Patient/Caregiver Education Details Patient Name: [name] Date of Service: 05/18/2015 10:00 AM Medical Record Number: [id number] Patient Account Number: [id number] Date of Birth/Gender: [birth year] (81 y.o. Male) Treating RN: Primary Care Physician: Frazier Richards Other Clinician: Jacqulyn Bath Referring Physician: Frazier Richards Treating Physician/Extender: Benjaman Pott in Treatment: 3 Education Assessment Education Provided To: Patient Education Topics Provided Electronic Signature(s) Signed: 05/18/2015 5:36:04 PM By: Judene Companion MD Entered By: Judene Companion on 05/18/2015 13:18:16 Melfa, [name] ([id number]) -------------------------------------------------------------------------------- Vitals Details Patient Name: [name] Date of Service: 05/18/2015 10:00 AM Medical Record Number: [id number] Patient Account Number: [id number] Date of Birth/Sex: 1920-05-20 (81 y.o. Male) Treating RN: Primary Care Physician: Frazier Richards Other Clinician: Jacqulyn Bath Referring Physician: Frazier Richards Treating Physician/Extender: Judene Companion Weeks in Treatment: 3 Vital Signs Time Taken: 09:40 Temperature (F): 97.5 Height (in): 67 Pulse (bpm): 60 Weight (lbs): 116.8 Respiratory Rate (breaths/min): 18 Body Mass Index (BMI): 18.3 Blood Pressure (mmHg): 122/60 Reference Range: 80 - 120 mg / dl Electronic Signature(s) Signed:  05/18/2015 12:18:30 PM By: Lorine Bears RCP, RRT, CHT Entered By: Lorine Bears on 05/18/2015 10:03:19

## 2015-05-18 NOTE — Progress Notes (Addendum)
[name], [name] ([id number]) Visit Report for 05/18/2015 HBO Details Patient Name: [name], [name] Date of Service: 05/18/2015 10:00 AM Medical Record Number: [id number] Patient Account Number: [id number] Date of Birth/Sex: 12-20-1919 (81 y.o. Male) Treating RN: Primary Care Physician: Frazier Richards Other Clinician: Jacqulyn Bath Referring Physician: Frazier Richards Treating Physician/Extender: Benjaman Pott in Treatment: 3 HBO Treatment Course Details Treatment Course Ordering Physician: Christin Fudge 1 Number: HBO Treatment Start Date: 05/05/2015 Total Treatments 40 Ordered: HBO Indication: Late Effect of Radiation HBO Treatment Details Treatment Number: 8 Patient Type: Outpatient Chamber Type: Monoplace Chamber #: HBO #[id number] Treatment Protocol: 2.0 ATA with 90 minutes oxygen, and no air breaks Treatment Details Compression Rate Down: 1.5 psi / minute De-Compression Rate Up: 1.5 psi / minute Air breaks and breathing Compress Tx Pressure Decompress Decompress periods Begins Reached Begins Ends (leave unused spaces blank) Chamber Pressure 1 ATA 2.0 ATA - - - - - - 2.0 ATA 1 ATA Clock Time (24 hr) 09:58 10:08 - - - - - - 11:38 11:48 Treatment Length: 110 (minutes) Treatment Segments: 4 Capillary Blood Glucose Pre Capillary Blood Glucose (mg/dl): Post Capillary Blood Glucose (mg/dl): Vital Signs Capillary Blood Glucose Reference Range: 80 - 120 mg / dl HBO Diabetic Blood Glucose Intervention Range: <131 mg/dl or >249 mg/dl Time Vitals Blood Respiratory Capillary Blood Glucose Pulse Action Type: Pulse: Temperature: Taken: Pressure: Rate: Glucose (mg/dl): Meter #: Oximetry (%) Taken: Pre 09:40 122/60 60 18 97.5 Post 11:55 138/68 48 18 97.4 Treatment Response Treatment Completion Status: Treatment Completed without Adverse Event Len, Omarri W. ([id number]) HBO Attestation I certify that I supervised this HBO treatment in accordance with Medicare  guidelines. A trained Yes emergency response team is readily available per hospital policies and procedures. Continue HBOT as ordered. Yes Electronic Signature(s) Signed: 05/18/2015 5:36:04 PM By: Judene Companion MD Previous Signature: 05/18/2015 12:18:30 PM Version By: Becky Sax, Sallie RCP, RRT, CHT Entered By: Judene Companion on 05/18/2015 13:16:06 Mcdonagh, Yunis W. ([id number]) -------------------------------------------------------------------------------- HBO Safety Checklist Details Patient Name: [name] Date of Service: 05/18/2015 10:00 AM Medical Record Number: [id number] Patient Account Number: [id number] Date of Birth/Sex: [birth year] (81 y.o. Male) Treating RN: Primary Care Physician: Frazier Richards Other Clinician: Jacqulyn Bath Referring Physician: Frazier Richards Treating Physician/Extender: Benjaman Pott in Treatment: 3 HBO Safety Checklist Items Safety Checklist Consent Form Signed Patient voided / foley secured and emptied When did you last eato 07:30 am Last dose of injectable or oral agent n/a NA Ostomy pouch emptied and vented if applicable NA All implantable devices assessed, documented and approved NA Intravenous access site secured and place Valuables secured Linens and cotton and cotton/polyester blend (less than 51% polyester) Personal oil-based products / skin lotions / body lotions removed Wigs or hairpieces removed Smoking or tobacco materials removed Books / newspapers / magazines / loose paper removed Cologne, aftershave, perfume and deodorant removed Jewelry removed (may wrap wedding band) NA Make-up removed Hair care products removed Battery operated devices (external) removed Heating patches and chemical warmers removed NA Titanium eyewear removed NA Nail polish cured greater than 10 hours NA Casting material cured greater than 10 hours Hearing aids removed Loose dentures or partials removed NA Prosthetics have  been removed Patient demonstrates correct use of air break device (if applicable) Patient concerns have been addressed Patient grounding bracelet on and cord attached to chamber Specifics for Inpatients (complete in addition to above) Medication sheet sent with patient Intravenous medications needed or due during therapy sent with patient Agustin, Robertt  W. ([id number]) Drainage tubes (e.g. nasogastric tube or chest tube secured and vented) Endotracheal or Tracheotomy tube secured Cuff deflated of air and inflated with saline Airway suctioned Electronic Signature(s) Signed: 05/18/2015 5:36:04 PM By: Judene Companion MD Previous Signature: 05/18/2015 12:18:30 PM Version By: Lorine Bears RCP, RRT, CHT Entered By: Judene Companion on 05/18/2015 13:15:45

## 2015-05-19 ENCOUNTER — Encounter: Payer: Medicare Other | Admitting: Surgery

## 2015-05-19 DIAGNOSIS — N3041 Irradiation cystitis with hematuria: Secondary | ICD-10-CM | POA: Diagnosis not present

## 2015-05-19 NOTE — Progress Notes (Signed)
[name], [name] ([id number]) Visit Report for 05/19/2015 HBO Details Patient Name: [name], [name]. Date of Service: 05/19/2015 10:00 AM Medical Record Number: [id number] Patient Account Number: [id number] Date of Birth/Sex: 1920/01/22 (81 y.o. Male) Treating RN: Primary Care Physician: Frazier Richards Other Clinician: Jacqulyn Bath Referring Physician: Frazier Richards Treating Physician/Extender: Frann Rider in Treatment: 3 HBO Treatment Course Details Treatment Course Ordering Physician: Christin Fudge 1 Number: HBO Treatment Start Date: 05/05/2015 Total Treatments 40 Ordered: HBO Indication: Late Effect of Radiation HBO Treatment Details Treatment Number: 9 Patient Type: Outpatient Chamber Type: Monoplace Chamber #: HBO #[id number] Treatment Protocol: 2.0 ATA with 90 minutes oxygen, and no air breaks Treatment Details Compression Rate Down: 1.5 psi / minute De-Compression Rate Up: 1.5 psi / minute Air breaks and breathing Compress Tx Pressure Decompress Decompress periods Begins Reached Begins Ends (leave unused spaces blank) Chamber Pressure 1 ATA 2.0 ATA - - - - - - 2.0 ATA 1 ATA Clock Time (24 hr) 10:02 10:12 - - - - - - 11:43 11:53 Treatment Length: 111 (minutes) Treatment Segments: 4 Capillary Blood Glucose Pre Capillary Blood Glucose (mg/dl): Post Capillary Blood Glucose (mg/dl): Vital Signs Capillary Blood Glucose Reference Range: 80 - 120 mg / dl HBO Diabetic Blood Glucose Intervention Range: <131 mg/dl or >249 mg/dl Time Vitals Blood Respiratory Capillary Blood Glucose Pulse Action Type: Pulse: Temperature: Taken: Pressure: Rate: Glucose (mg/dl): Meter #: Oximetry (%) Taken: Pre 09:55 118/70 72 18 98 Post 11:55 122/62 48 18 97.3 Treatment Response Treatment Completion Status: Treatment Completed without Adverse Event [name], [name] ([id number]) HBO Attestation I certify that I supervised this HBO treatment in accordance with Medicare  guidelines. A trained Yes emergency response team is readily available per hospital policies and procedures. Continue HBOT as ordered. Yes Electronic Signature(s) Signed: 05/19/2015 12:25:46 PM By: Christin Fudge MD, FACS Previous Signature: 05/19/2015 12:09:13 PM Version By: Lorine Bears RCP, RRT, CHT Entered By: Christin Fudge on 05/19/2015 12:25:46 Durell, [name] ([id number]) -------------------------------------------------------------------------------- HBO Safety Checklist Details Patient Name: [name] Date of Service: 05/19/2015 10:00 AM Medical Record Number: [id number] Patient Account Number: [id number] Date of Birth/Sex: [birth year] (81 y.o. Male) Treating RN: Primary Care Physician: Frazier Richards Other Clinician: Jacqulyn Bath Referring Physician: Frazier Richards Treating Physician/Extender: Frann Rider in Treatment: 3 HBO Safety Checklist Items Safety Checklist Consent Form Signed Patient voided / foley secured and emptied When did you last eato 07:30 am Last dose of injectable or oral agent n/a NA Ostomy pouch emptied and vented if applicable NA All implantable devices assessed, documented and approved NA Intravenous access site secured and place Valuables secured Linens and cotton and cotton/polyester blend (less than 51% polyester) Personal oil-based products / skin lotions / body lotions removed Wigs or hairpieces removed Smoking or tobacco materials removed Books / newspapers / magazines / loose paper removed Cologne, aftershave, perfume and deodorant removed Jewelry removed (may wrap wedding band) Make-up removed Hair care products removed Battery operated devices (external) removed Heating patches and chemical warmers removed NA Titanium eyewear removed NA Nail polish cured greater than 10 hours NA Casting material cured greater than 10 hours Hearing aids removed Loose dentures or partials removed NA Prosthetics  have been removed Patient demonstrates correct use of air break device (if applicable) Patient concerns have been addressed Patient grounding bracelet on and cord attached to chamber Specifics for Inpatients (complete in addition to above) Medication sheet sent with patient Intravenous medications needed or due during therapy sent with patient [name], [name]  W. ([id number]) Drainage tubes (e.g. nasogastric tube or chest tube secured and vented) Endotracheal or Tracheotomy tube secured Cuff deflated of air and inflated with saline Airway suctioned Electronic Signature(s) Signed: 05/19/2015 12:09:13 PM By: Lorine Bears RCP, RRT, CHT Entered By: Lorine Bears on 05/19/2015 10:05:38

## 2015-05-19 NOTE — Progress Notes (Signed)
[name], [name] ([id number]) Visit Report for 05/14/2015 HBO Details Patient Name: [name], [name] Date of Service: 05/14/2015 10:00 AM Medical Record Number: [id number] Patient Account Number: [id number] Date of Birth/Sex: Mar 13, 1920 (81 y.o. Male) Treating RN: Primary Care Physician: Frazier Richards Other Clinician: Jacqulyn Bath Referring Physician: Frazier Richards Treating Physician/Extender: Frann Rider in Treatment: 3 HBO Treatment Course Details Treatment Course Ordering Physician: Christin Fudge 1 Number: HBO Treatment Start Date: 05/05/2015 Total Treatments 40 Ordered: HBO Indication: Late Effect of Radiation HBO Treatment Details Treatment Number: 7 Patient Type: Outpatient Chamber Type: Monoplace Chamber #: HBO #[id number] Treatment Protocol: 2.0 ATA with 90 minutes oxygen, and no air breaks Treatment Details Compression Rate Down: 1.5 psi / minute De-Compression Rate Up: 1.5 psi / minute Air breaks and breathing Compress Tx Pressure Decompress Decompress periods Begins Reached Begins Ends (leave unused spaces blank) Chamber Pressure 1 ATA 2.0 ATA - - - - - - 2.0 ATA 1 ATA Clock Time (24 hr) 09:47 09:57 - - - - - - 11:27 11:37 Treatment Length: 110 (minutes) Treatment Segments: 4 Capillary Blood Glucose Pre Capillary Blood Glucose (mg/dl): Post Capillary Blood Glucose (mg/dl): Vital Signs Capillary Blood Glucose Reference Range: 80 - 120 mg / dl HBO Diabetic Blood Glucose Intervention Range: <131 mg/dl or >249 mg/dl Time Vitals Blood Respiratory Capillary Blood Glucose Pulse Action Type: Pulse: Temperature: Taken: Pressure: Rate: Glucose (mg/dl): Meter #: Oximetry (%) Taken: Pre 09:45 122/64 54 18 97.5 Post 11:40 112/64 66 18 97.7 Treatment Response Treatment Completion Status: Treatment Completed without Adverse Event [name], [name] ([id number]) HBO Attestation I certify that I supervised this HBO treatment in accordance with Medicare  guidelines. A trained Yes emergency response team is readily available per hospital policies and procedures. Continue HBOT as ordered. Yes Electronic Signature(s) Signed: 05/18/2015 5:36:04 PM By: Judene Companion MD Signed: 05/19/2015 8:06:26 AM By: Christin Fudge MD, FACS Previous Signature: 05/14/2015 2:36:08 PM Version By: Lorine Bears RCP, RRT, CHT Entered By: Judene Companion on 05/18/2015 16:47:19 [name], [name] ([id number]) -------------------------------------------------------------------------------- HBO Safety Checklist Details Patient Name: [name] Date of Service: 05/14/2015 10:00 AM Medical Record Number: [id number] Patient Account Number: [id number] Date of Birth/Sex: December 04, 1919 (81 y.o. Male) Treating RN: Primary Care Physician: Frazier Richards Other Clinician: Jacqulyn Bath Referring Physician: Frazier Richards Treating Physician/Extender: Frann Rider in Treatment: 3 HBO Safety Checklist Items Safety Checklist Consent Form Signed Patient voided / foley secured and emptied When did you last eato 07:00 am Last dose of injectable or oral agent n/a NA Ostomy pouch emptied and vented if applicable NA All implantable devices assessed, documented and approved NA Intravenous access site secured and place Valuables secured Linens and cotton and cotton/polyester blend (less than 51% polyester) Personal oil-based products / skin lotions / body lotions removed Wigs or hairpieces removed Smoking or tobacco materials removed Books / newspapers / magazines / loose paper removed Cologne, aftershave, perfume and deodorant removed Jewelry removed (may wrap wedding band) Make-up removed Hair care products removed Battery operated devices (external) removed Heating patches and chemical warmers removed NA Titanium eyewear removed NA Nail polish cured greater than 10 hours NA Casting material cured greater than 10 hours Hearing aids removed Loose  dentures or partials removed NA Prosthetics have been removed Patient demonstrates correct use of air break device (if applicable) Patient concerns have been addressed Patient grounding bracelet on and cord attached to chamber Specifics for Inpatients (complete in addition to above) Medication sheet sent with patient Intravenous medications needed or  due during therapy sent with patient HARWOOD, NALL. ([id number]) Drainage tubes (e.g. nasogastric tube or chest tube secured and vented) Endotracheal or Tracheotomy tube secured Cuff deflated of air and inflated with saline Airway suctioned Electronic Signature(s) Signed: 05/14/2015 2:36:08 PM By: Lorine Bears RCP, RRT, CHT Entered By: Becky Sax, Amado Nash on 05/14/2015 09:51:58

## 2015-05-19 NOTE — Progress Notes (Signed)
DYLLEN, MENNING ([id number]) Visit Report for 05/18/2015 Physician Orders Details Patient Name: [name], [name] Date of Service: 05/18/2015 10:00 AM Medical Record Number: [id number] Patient Account Number: [id number] Date of Birth/Sex: 04-11-1920 (81 y.o. 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 Facility Procedures CPT4 Code: 70177939 Description: (Facility Use Only) HBOT, full body chamber, 17min Modifier: Quantity: 4 Physician Procedures CPT4 Code: 0300923 Description: 30076 - WC PHYS HYPERBARIC OXYGEN THERAPY ICD-10 Description Diagnosis N30.41 Irradiation cystitis with hematuria Modifier: Quantity: 1 Electronic Signature(s) Signed: 05/18/2015 5:41:16 PM By: Judene Companion MD Previous Signature: 05/18/2015 5:36:04 PM Version By: Judene Companion MD Previous Signature: 05/18/2015 12:18:30 PM Version By: Lorine Bears RCP, RRT, CHT Entered By: Judene Companion on 05/18/2015 17:38:23

## 2015-05-19 NOTE — Progress Notes (Signed)
[name] ([id number]) Visit Report for 05/19/2015 Arrival Information Details Patient Name: [name], [name] Date of Service: 05/19/2015 10:00 AM Medical Record Number: [id number] Patient Account Number: [id number] Date of Birth/Sex: [birth year] (81 y.o. Male) Treating RN: Primary Care Physician: Frazier Richards Other Clinician: Jacqulyn Bath Referring Physician: Frazier Richards Treating Physician/Extender: Frann Rider in Treatment: 3 Visit Information History Since Last Visit Added or deleted any medications: No Patient Arrived: Ambulatory Any new allergies or adverse reactions: No Arrival Time: 09:40 Had a fall or experienced change in No Accompanied By: self activities of daily living that may affect Transfer Assistance: Stretcher risk of falls: Patient Identification Verified: Yes Signs or symptoms of abuse/neglect since last No Secondary Verification Process Yes visito Completed: Hospitalized since last visit: No Patient Has Alerts: Yes Pain Present Now: No Electronic Signature(s) Signed: 05/19/2015 12:09:13 PM By: Lorine Bears RCP, RRT, CHT Entered By: Becky Sax, Amado Nash on 05/19/2015 09:52:26 Zafar, [name] ([id number]) -------------------------------------------------------------------------------- Encounter Discharge Information Details Patient Name: [name] Date of Service: 05/19/2015 10:00 AM Medical Record Number: [id number] Patient Account Number: [id number] Date of Birth/Sex: January 30, 1920 (81 y.o. Male) Treating RN: Primary Care Physician: Frazier Richards Other Clinician: Jacqulyn Bath Referring Physician: Frazier Richards Treating Physician/Extender: Frann Rider in Treatment: 3 Encounter Discharge Information Items Discharge Pain Level: 0 Discharge Condition: Stable Ambulatory Status: Ambulatory Discharge Destination: Home Private Transportation: Auto Accompanied By: self Schedule Follow-up  Appointment: No Medication Reconciliation completed and No provided to Patient/Care Donnell Beauchamp: Clinical Summary of Care: Notes Patient has an HBO treatment scheduled on10/12/16 at 10:00 am. Electronic Signature(s) Signed: 05/19/2015 12:09:13 PM By: Lorine Bears RCP, RRT, CHT Entered By: Lorine Bears on 05/19/2015 12:08:59 [name] ([id number]) -------------------------------------------------------------------------------- Vitals Details Patient Name: [name] Date of Service: 05/19/2015 10:00 AM Medical Record Number: [id number] Patient Account Number: [id number] Date of Birth/Sex: 04-29-1920 (81 y.o. Male) Treating RN: Primary Care Physician: Frazier Richards Other Clinician: Jacqulyn Bath Referring Physician: Frazier Richards Treating Physician/Extender: Frann Rider in Treatment: 3 Vital Signs Time Taken: 09:55 Temperature (F): 98.0 Height (in): 67 Pulse (bpm): 72 Weight (lbs): 116.8 Respiratory Rate (breaths/min): 18 Body Mass Index (BMI): 18.3 Blood Pressure (mmHg): 118/70 Reference Range: 80 - 120 mg / dl Electronic Signature(s) Signed: 05/19/2015 12:09:13 PM By: Lorine Bears RCP, RRT, CHT Entered By: Becky Sax, Amado Nash on 05/19/2015 10:04:58

## 2015-05-20 ENCOUNTER — Encounter: Payer: Medicare Other | Admitting: Surgery

## 2015-05-20 DIAGNOSIS — N3041 Irradiation cystitis with hematuria: Secondary | ICD-10-CM | POA: Diagnosis not present

## 2015-05-20 NOTE — Progress Notes (Signed)
WALTON, DIGILIO ([id number]) Visit Report for 05/20/2015 Arrival Information Details Patient Name: PHYLLIS, WHITEFIELD Date of Service: 05/20/2015 10:00 AM Medical Record Number: [id number] Patient Account Number: [id number] Date of Birth/Sex: April 04, 1920 (81 y.o. Male) Treating RN: Primary Care Physician: Frazier Richards Other Clinician: Jacqulyn Bath Referring Physician: Frazier Richards Treating Physician/Extender: BURNS III, Charlean Sanfilippo in Treatment: 3 Visit Information History Since Last Visit Added or deleted any medications: No Patient Arrived: Ambulatory Any new allergies or adverse reactions: No Arrival Time: 09:30 Had a fall or experienced change in No Accompanied By: self activities of daily living that may affect Transfer Assistance: Manual risk of falls: Patient Identification Verified: Yes Signs or symptoms of abuse/neglect since last No Secondary Verification Process Yes visito Completed: Hospitalized since last visit: No Patient Has Alerts: Yes Pain Present Now: No Electronic Signature(s) Signed: 05/20/2015 12:23:42 PM By: Lorine Bears RCP, RRT, CHT Entered By: Becky Sax, Amado Nash on 05/20/2015 09:51:44 [place], Alejandro W. ([id number]) -------------------------------------------------------------------------------- Encounter Discharge Information Details Patient Name: [name] Date of Service: 05/20/2015 10:00 AM Medical Record Number: [id number] Patient Account Number: [id number] Date of Birth/Sex: 05/27/1920 (81 y.o. Male) Treating RN: Primary Care Physician: Frazier Richards Other Clinician: Jacqulyn Bath Referring Physician: Frazier Richards Treating Physician/Extender: BURNS III, Charlean Sanfilippo in Treatment: 3 Encounter Discharge Information Items Discharge Pain Level: 0 Discharge Condition: Stable Ambulatory Status: Ambulatory Discharge Destination: Home Private Transportation: Auto Accompanied By: self Schedule  Follow-up Appointment: No Medication Reconciliation completed and No provided to Patient/Care Sopheap Boehle: Clinical Summary of Care: Notes Patient has an HBO treatment scheduled on 05/21/15 at 10:00 am. Electronic Signature(s) Signed: 05/20/2015 12:23:42 PM By: Lorine Bears RCP, RRT, CHT Entered By: Becky Sax, Amado Nash on 05/20/2015 11:54:56 [place], [name] ([id number]) -------------------------------------------------------------------------------- Vitals Details Patient Name: [name] Date of Service: 05/20/2015 10:00 AM Medical Record Number: [id number] Patient Account Number: [id number] Date of Birth/Sex: 1919-09-27 (81 y.o. Male) Treating RN: Primary Care Physician: Frazier Richards Other Clinician: Jacqulyn Bath Referring Physician: Frazier Richards Treating Physician/Extender: BURNS III, Charlean Sanfilippo in Treatment: 3 Vital Signs Time Taken: 09:35 Temperature (F): 97.6 Height (in): 67 Pulse (bpm): 66 Weight (lbs): 116.8 Respiratory Rate (breaths/min): 18 Body Mass Index (BMI): 18.3 Blood Pressure (mmHg): 118/72 Reference Range: 80 - 120 mg / dl Electronic Signature(s) Signed: 05/20/2015 12:23:42 PM By: Lorine Bears RCP, RRT, CHT Entered By: Lorine Bears on 05/20/2015 09:52:10

## 2015-05-20 NOTE — Progress Notes (Signed)
[name] ([id number]) Visit Report for 05/20/2015 HBO Details Patient Name: [name], [name]. Date of Service: 05/20/2015 10:00 AM Medical Record Number: [id number] Patient Account Number: [id number] Date of Birth/Sex: 1920/05/03 (81 y.o. Male) Treating RN: Primary Care Physician: Frazier Richards Other Clinician: Jacqulyn Bath Referring Physician: Frazier Richards Treating Physician/Extender: BURNS III, Charlean Sanfilippo in Treatment: 3 HBO Treatment Course Details Treatment Course Ordering Physician: Christin Fudge 1 Number: HBO Treatment Start Date: 05/05/2015 Total Treatments 40 Ordered: HBO Indication: Late Effect of Radiation HBO Treatment Details Treatment Number: 10 Patient Type: Outpatient Chamber Type: Monoplace Chamber #: HBO #[id number] Treatment Protocol: 2.0 ATA with 90 minutes oxygen, and no air breaks Treatment Details Compression Rate Down: 1.5 psi / minute De-Compression Rate Up: 1.5 psi / minute Air breaks and breathing Compress Tx Pressure Decompress Decompress periods Begins Reached Begins Ends (leave unused spaces blank) Chamber Pressure 1 ATA 2.0 ATA - - - - - - 2.0 ATA 1 ATA Clock Time (24 hr) 09:50 10:00 - - - - - - 11:30 11:42 Treatment Length: 112 (minutes) Treatment Segments: 4 Capillary Blood Glucose Pre Capillary Blood Glucose (mg/dl): Post Capillary Blood Glucose (mg/dl): Vital Signs Capillary Blood Glucose Reference Range: 80 - 120 mg / dl HBO Diabetic Blood Glucose Intervention Range: <131 mg/dl or >249 mg/dl Time Vitals Blood Respiratory Capillary Blood Glucose Pulse Action Type: Pulse: Temperature: Taken: Pressure: Rate: Glucose (mg/dl): Meter #: Oximetry (%) Taken: Pre 09:35 118/72 66 18 97.6 Post 11:45 124/70 48 18 97.3 Treatment Response Treatment Completion Status: Treatment Completed without Adverse Event [name], [name] ([id number]) HBO Attestation I certify that I supervised this HBO treatment in accordance with  Medicare guidelines. A trained Yes emergency response team is readily available per hospital policies and procedures. Continue HBOT as ordered. Yes Electronic Signature(s) Signed: 05/20/2015 3:35:09 PM By: Loletha Grayer MD Previous Signature: 05/20/2015 12:23:42 PM Version By: Lorine Bears RCP, RRT, CHT Entered By: Loletha Grayer on 05/20/2015 13:34:18 [name] ([id number]) -------------------------------------------------------------------------------- HBO Safety Checklist Details Patient Name: [name] Date of Service: 05/20/2015 10:00 AM Medical Record Number: [id number] Patient Account Number: [id number] Date of Birth/Sex: Apr 28, 1920 (81 y.o. Male) Treating RN: Primary Care Physician: Frazier Richards Other Clinician: Jacqulyn Bath Referring Physician: Frazier Richards Treating Physician/Extender: BURNS III, Charlean Sanfilippo in Treatment: 3 HBO Safety Checklist Items Safety Checklist Consent Form Signed Patient voided / foley secured and emptied When did you last eato 07:30 am Last dose of injectable or oral agent n/a NA Ostomy pouch emptied and vented if applicable NA All implantable devices assessed, documented and approved NA Intravenous access site secured and place Valuables secured Linens and cotton and cotton/polyester blend (less than 51% polyester) Personal oil-based products / skin lotions / body lotions removed Wigs or hairpieces removed Smoking or tobacco materials removed Books / newspapers / magazines / loose paper removed Cologne, aftershave, perfume and deodorant removed Jewelry removed (may wrap wedding band) Make-up removed Hair care products removed Battery operated devices (external) removed Heating patches and chemical warmers removed NA Titanium eyewear removed NA Nail polish cured greater than 10 hours NA Casting material cured greater than 10 hours Hearing aids removed Loose dentures or partials removed NA  Prosthetics have been removed Patient demonstrates correct use of air break device (if applicable) Patient concerns have been addressed Patient grounding bracelet on and cord attached to chamber Specifics for Inpatients (complete in addition to above) Medication sheet sent with patient Intravenous medications needed or due during therapy sent with  patient [name] ([id number]) Drainage tubes (e.g. nasogastric tube or chest tube secured and vented) Endotracheal or Tracheotomy tube secured Cuff deflated of air and inflated with saline Airway suctioned Electronic Signature(s) Signed: 05/20/2015 12:23:42 PM By: Lorine Bears RCP, RRT, CHT Entered By: Lorine Bears on 05/20/2015 09:52:55

## 2015-05-21 ENCOUNTER — Encounter: Payer: Medicare Other | Admitting: Surgery

## 2015-05-21 DIAGNOSIS — N3041 Irradiation cystitis with hematuria: Secondary | ICD-10-CM | POA: Diagnosis not present

## 2015-05-21 NOTE — Progress Notes (Signed)
BRANDYN, [name] ([id number]) Visit Report for 05/21/2015 Arrival Information Details Patient Name: [name], [name] Date of Service: 05/21/2015 10:00 AM Medical Record Number: [id number] Patient Account Number: [id number] Date of Birth/Sex: [birth year] (81 y.o. Male) Treating RN: Primary Care Physician: Frazier Richards Other Clinician: Jacqulyn Bath Referring Physician: Frazier Richards Treating Physician/Extender: Frann Rider in Treatment: 4 Visit Information History Since Last Visit Added or deleted any medications: No Patient Arrived: Ambulatory Any new allergies or adverse reactions: No Arrival Time: 09:35 Had a fall or experienced change in No Accompanied By: wife activities of daily living that may affect Transfer Assistance: Manual risk of falls: Patient Identification Verified: Yes Signs or symptoms of abuse/neglect since last No Secondary Verification Process Yes visito Completed: Hospitalized since last visit: No Patient Has Alerts: Yes Pain Present Now: No Electronic Signature(s) Signed: 05/21/2015 3:16:23 PM By: Lorine Bears RCP, RRT, CHT Entered By: Becky Sax, Amado Nash on 05/21/2015 09:58:01 [place], [name] ([id number]) -------------------------------------------------------------------------------- Encounter Discharge Information Details Patient Name: [name] Date of Service: 05/21/2015 10:00 AM Medical Record Number: [id number] Patient Account Number: [id number] Date of Birth/Sex: 31-Jan-1920 (81 y.o. Male) Treating RN: Primary Care Physician: Frazier Richards Other Clinician: Jacqulyn Bath Referring Physician: Frazier Richards Treating Physician/Extender: Frann Rider in Treatment: 4 Encounter Discharge Information Items Discharge Pain Level: 0 Discharge Condition: Stable Ambulatory Status: Ambulatory Discharge Destination: Home Private Transportation: Auto Accompanied By: wife Schedule Follow-up  Appointment: No Medication Reconciliation completed and No provided to Patient/Care Tieshia Rettinger: Clinical Summary of Care: Notes Patient has an HBO treatment scheduled on aa10/14/16 at 10:00 am. Electronic Signature(s) Signed: 05/21/2015 3:16:23 PM By: Lorine Bears RCP, RRT, CHT Entered By: Becky Sax, Amado Nash on 05/21/2015 12:44:34 [place], [name] ([id number]) -------------------------------------------------------------------------------- Vitals Details Patient Name: [name] Date of Service: 05/21/2015 10:00 AM Medical Record Number: [id number] Patient Account Number: [id number] Date of Birth/Sex: [birth year] (81 y.o. Male) Treating RN: Primary Care Physician: Frazier Richards Other Clinician: Jacqulyn Bath Referring Physician: Frazier Richards Treating Physician/Extender: Frann Rider in Treatment: 4 Vital Signs Time Taken: 09:35 Temperature (F): 97.8 Height (in): 67 Pulse (bpm): 60 Weight (lbs): 116.8 Respiratory Rate (breaths/min): 18 Body Mass Index (BMI): 18.3 Blood Pressure (mmHg): 118/70 Reference Range: 80 - 120 mg / dl Electronic Signature(s) Signed: 05/21/2015 3:16:23 PM By: Lorine Bears RCP, RRT, CHT Entered By: Becky Sax, Amado Nash on 05/21/2015 09:58:25

## 2015-05-21 NOTE — Progress Notes (Signed)
[name] ([id number]) Visit Report for 05/21/2015 HBO Details Patient Name: [name], [name]. Date of Service: 05/21/2015 10:00 AM Medical Record Number: [id number] Patient Account Number: [id number] Date of Birth/Sex: 08-23-1919 (81 y.o. Male) Treating RN: Primary Care Physician: [name] Other Clinician: Jacqulyn [name] Referring Physician: Frazier [name] Treating Physician/Extender: [name] in Treatment: 4 HBO Treatment Course Details Treatment Course Ordering Physician: [name] 1 Number: HBO Treatment Start Date: 05/05/2015 Total Treatments 40 Ordered: HBO Indication: Late Effect of Radiation HBO Treatment Details Treatment Number: 11 Patient Type: Outpatient Chamber Type: Monoplace Chamber #: HBO #[id number] Treatment Protocol: 2.0 ATA with 90 minutes oxygen, and no air breaks Treatment Details Compression Rate Down: 1.5 psi / minute De-Compression Rate Up: 1.5 psi / minute Air breaks and breathing Compress Tx Pressure Decompress Decompress periods Begins Reached Begins Ends (leave unused spaces blank) Chamber Pressure 1 ATA 2.0 ATA - - - - - - 2.0 ATA 1 ATA Clock Time (24 hr) 09:55 10:05 - - - - - - 11:36 11:46 Treatment Length: 111 (minutes) Treatment Segments: 4 Capillary Blood Glucose Pre Capillary Blood Glucose (mg/dl): Post Capillary Blood Glucose (mg/dl): Vital Signs Capillary Blood Glucose Reference Range: 80 - 120 mg / dl HBO Diabetic Blood Glucose Intervention Range: <131 mg/dl or >249 mg/dl Time Vitals Blood Respiratory Capillary Blood Glucose Pulse Action Type: Pulse: Temperature: Taken: Pressure: Rate: Glucose (mg/dl): Meter #: Oximetry (%) Taken: Pre 09:35 118/70 60 18 97.8 Post 11:50 122/68 78 18 97.9 Treatment Response Treatment Completion Status: Treatment Completed without Adverse Event [name] ([id number]) Electronic Signature(s) Signed: 05/21/2015 3:16:23 PM By: [name] RCP,  RRT, CHT Signed: 05/21/2015 3:40:39 PM By: [name] Fudge MD, FACS Previous Signature: 05/21/2015 12:12:13 PM Version By: [name] Fudge MD, FACS Entered By: [name] on 05/21/2015 12:43:52 [name] ([id number]) -------------------------------------------------------------------------------- HBO Safety Checklist Details Patient Name: [name] Date of Service: 05/21/2015 10:00 AM Medical Record Number: [id number] Patient Account Number: [id number] Date of Birth/Sex: 21-Jun-1920 (81 y.o. Male) Treating RN: Primary Care Physician: [name] Other Clinician: Jacqulyn [name] Referring Physician: Frazier [name] Treating Physician/Extender: [name] in Treatment: 4 HBO Safety Checklist Items Safety Checklist Consent Form Signed Patient voided / foley secured and emptied When did you last eato 07:00 am Last dose of injectable or oral agent n/a NA Ostomy pouch emptied and vented if applicable NA All implantable devices assessed, documented and approved NA Intravenous access site secured and place Valuables secured Linens and cotton and cotton/polyester blend (less than 51% polyester) Personal oil-based products / skin lotions / body lotions removed Wigs or hairpieces removed Smoking or tobacco materials removed Books / newspapers / magazines / loose paper removed Cologne, aftershave, perfume and deodorant removed Jewelry removed (may wrap wedding band) Make-up removed Hair care products removed Battery operated devices (external) removed Heating patches and chemical warmers removed NA Titanium eyewear removed NA Nail polish cured greater than 10 hours NA Casting material cured greater than 10 hours Hearing aids removed Loose dentures or partials removed NA Prosthetics have been removed Patient demonstrates correct use of air break device (if applicable) Patient concerns have been addressed Patient grounding bracelet on and cord  attached to chamber Specifics for Inpatients (complete in addition to above) Medication sheet sent with patient Intravenous medications needed or due during therapy sent with patient [name], [name]. ([id number]) Drainage tubes (e.g. nasogastric tube or chest tube secured and vented) Endotracheal or Tracheotomy tube secured Cuff deflated of air and inflated  with saline Airway suctioned Electronic Signature(s) Signed: 05/21/2015 3:16:23 PM By: [name] RCP, RRT, CHT Entered By: [name] on 05/21/2015 09:59:06

## 2015-05-22 ENCOUNTER — Encounter: Payer: Medicare Other | Admitting: Surgery

## 2015-05-22 DIAGNOSIS — N3041 Irradiation cystitis with hematuria: Secondary | ICD-10-CM | POA: Diagnosis not present

## 2015-05-22 NOTE — Progress Notes (Signed)
SEENA, FACE ([id number]) Visit Report for 05/22/2015 HBO Details Patient Name: [name], [name]. Date of Service: 05/22/2015 10:00 AM Medical Record Number: [id number] Patient Account Number: [id number] Date of Birth/Sex: July 06, 1920 (81 y.o. Male) Treating RN: Primary Care Physician: Frazier Richards Other Clinician: Jacqulyn Bath Referring Physician: Frazier Richards Treating Physician/Extender: Frann Rider in Treatment: 4 HBO Treatment Course Details Treatment Course Ordering Physician: Christin Fudge 1 Number: HBO Treatment Start Date: 05/05/2015 Total Treatments 40 Ordered: HBO Indication: Late Effect of Radiation HBO Treatment Details Treatment Number: 12 Patient Type: Outpatient Chamber Type: Monoplace Chamber #: HBO #[id number] Treatment Protocol: 2.0 ATA with 90 minutes oxygen, and no air breaks Treatment Details Compression Rate Down: 1.5 psi / minute De-Compression Rate Up: 1.5 psi / minute Air breaks and breathing Compress Tx Pressure Decompress Decompress periods Begins Reached Begins Ends (leave unused spaces blank) Chamber Pressure 1 ATA 2.0 ATA - - - - - - 2.0 ATA 1 ATA Clock Time (24 hr) 09:50 10:01 - - - - - - 11:31 11:41 Treatment Length: 111 (minutes) Treatment Segments: 4 Capillary Blood Glucose Pre Capillary Blood Glucose (mg/dl): Post Capillary Blood Glucose (mg/dl): Vital Signs Capillary Blood Glucose Reference Range: 80 - 120 mg / dl HBO Diabetic Blood Glucose Intervention Range: <131 mg/dl or >249 mg/dl Time Vitals Blood Respiratory Capillary Blood Glucose Pulse Action Type: Pulse: Temperature: Taken: Pressure: Rate: Glucose (mg/dl): Meter #: Oximetry (%) Taken: Pre 09:30 122/62 72 18 97 Post 11:45 122/66 72 18 97.4 Treatment Response Treatment Completion Status: Treatment Completed without Adverse Event [name], [name] ([id number]) Electronic Signature(s) Signed: 05/22/2015 2:47:20 PM By: Lorine Bears RCP, RRT,  CHT Signed: 05/22/2015 4:02:00 PM By: Christin Fudge MD, FACS Previous Signature: 05/22/2015 11:53:10 AM Version By: Christin Fudge MD, FACS Entered By: Lorine Bears on 05/22/2015 11:55:08 [name], [name] ([id number]) -------------------------------------------------------------------------------- HBO Safety Checklist Details Patient Name: [name] Date of Service: 05/22/2015 10:00 AM Medical Record Number: [id number] Patient Account Number: [id number] Date of Birth/Sex: 1920/05/27 (81 y.o. Male) Treating RN: Primary Care Physician: Frazier Richards Other Clinician: Jacqulyn Bath Referring Physician: Frazier Richards Treating Physician/Extender: Frann Rider in Treatment: 4 HBO Safety Checklist Items Safety Checklist Consent Form Signed Patient voided / foley secured and emptied When did you last eato 07:15 am Last dose of injectable or oral agent n/a NA Ostomy pouch emptied and vented if applicable NA All implantable devices assessed, documented and approved NA Intravenous access site secured and place Valuables secured Linens and cotton and cotton/polyester blend (less than 51% polyester) Personal oil-based products / skin lotions / body lotions removed Wigs or hairpieces removed Smoking or tobacco materials removed Books / newspapers / magazines / loose paper removed Cologne, aftershave, perfume and deodorant removed Jewelry removed (may wrap wedding band) Make-up removed Hair care products removed Battery operated devices (external) removed Heating patches and chemical warmers removed NA Titanium eyewear removed NA Nail polish cured greater than 10 hours NA Casting material cured greater than 10 hours Hearing aids removed Loose dentures or partials removed NA Prosthetics have been removed Patient demonstrates correct use of air break device (if applicable) Patient concerns have been addressed Patient grounding bracelet on and cord  attached to chamber Specifics for Inpatients (complete in addition to above) Medication sheet sent with patient Intravenous medications needed or due during therapy sent with patient [name], [name]. ([id number]) Drainage tubes (e.g. nasogastric tube or chest tube secured and vented) Endotracheal or Tracheotomy tube secured Cuff deflated of air and inflated  with saline Airway suctioned Electronic Signature(s) Signed: 05/22/2015 2:47:20 PM By: Becky Sax, Sallie RCP, RRT, CHT Entered By: Lorine Bears on 05/22/2015 09:35:50

## 2015-05-22 NOTE — Progress Notes (Signed)
MILLS, MITTON ([id number]) Visit Report for 05/22/2015 Arrival Information Details Patient Name: [name], [name] Date of Service: 05/22/2015 10:00 AM Medical Record Number: [id number] Patient Account Number: [id number] Date of Birth/Sex: May 15, 1920 (81 y.o. Male) Treating RN: Primary Care Physician: Frazier Richards Other Clinician: Jacqulyn Bath Referring Physician: Frazier Richards Treating Physician/Extender: Frann Rider in Treatment: 4 Visit Information History Since Last Visit Added or deleted any medications: No Patient Arrived: Ambulatory Any new allergies or adverse reactions: No Arrival Time: 09:30 Had a fall or experienced change in No Accompanied By: [place] activities of daily living that may affect Transfer Assistance: Manual risk of falls: Patient Identification Verified: Yes Signs or symptoms of abuse/neglect since last No Secondary Verification Process Yes visito Completed: Hospitalized since last visit: No Patient Has Alerts: Yes Pain Present Now: No Electronic Signature(s) Signed: 05/22/2015 2:47:20 PM By: Lorine Bears RCP, RRT, CHT Entered By: Becky Sax, Amado Nash on 05/22/2015 09:34:25 [name], [name] ([id number]) -------------------------------------------------------------------------------- Encounter Discharge Information Details Patient Name: [name] Date of Service: 05/22/2015 10:00 AM Medical Record Number: [id number] Patient Account Number: [id number] Date of Birth/Sex: 1920/04/01 (81 y.o. Male) Treating RN: Primary Care Physician: Frazier Richards Other Clinician: Jacqulyn Bath Referring Physician: Frazier Richards Treating Physician/Extender: Frann Rider in Treatment: 4 Encounter Discharge Information Items Discharge Pain Level: 0 Discharge Condition: Stable Ambulatory Status: Ambulatory Discharge Destination: Home Private Transportation: Auto Accompanied By: wife Schedule Follow-up  Appointment: No Medication Reconciliation completed and No provided to Patient/Care Bronislaw Switzer: Clinical Summary of Care: Notes Patient has an HBO treatment scheduled on 05/25/15 at 10:00 am. Electronic Signature(s) Signed: 05/22/2015 2:47:20 PM By: Lorine Bears RCP, RRT, CHT Entered By: Becky Sax, Amado Nash on 05/22/2015 11:55:53 [name], [name] ([id number]) -------------------------------------------------------------------------------- Vitals Details Patient Name: [name] Date of Service: 05/22/2015 10:00 AM Medical Record Number: [id number] Patient Account Number: [id number] Date of Birth/Sex: [birth year] (81 y.o. Male) Treating RN: Primary Care Physician: Frazier Richards Other Clinician: Jacqulyn Bath Referring Physician: Frazier Richards Treating Physician/Extender: Frann Rider in Treatment: 4 Vital Signs Time Taken: 09:30 Temperature (F): 97.0 Height (in): 67 Pulse (bpm): 72 Weight (lbs): 116.8 Respiratory Rate (breaths/min): 18 Body Mass Index (BMI): 18.3 Blood Pressure (mmHg): 122/62 Reference Range: 80 - 120 mg / dl Electronic Signature(s) Signed: 05/22/2015 2:47:20 PM By: Lorine Bears RCP, RRT, CHT Entered By: Lorine Bears on 05/22/2015 09:35:11

## 2015-05-25 ENCOUNTER — Encounter: Payer: Medicare Other | Admitting: Surgery

## 2015-05-25 DIAGNOSIS — N3041 Irradiation cystitis with hematuria: Secondary | ICD-10-CM | POA: Diagnosis not present

## 2015-05-25 NOTE — Progress Notes (Signed)
JETTSON, CRABLE ([id number]) Visit Report for 05/25/2015 Arrival Information Details Patient Name: [name], [name] Date of Service: 05/25/2015 10:00 AM Medical Record Number: [id number] Patient Account Number: [id number] Date of Birth/Sex: October 04, 1919 (81 y.o. Male) Treating RN: Primary Care Physician: Frazier Richards Other Clinician: Jacqulyn Bath Referring Physician: Frazier Richards Treating Physician/Extender: Frann Rider in Treatment: 4 Visit Information History Since Last Visit Added or deleted any medications: No Patient Arrived: Ambulatory Any new allergies or adverse reactions: No Arrival Time: 09:45 Had a fall or experienced change in No Accompanied By: wife activities of daily living that may affect Transfer Assistance: Manual risk of falls: Patient Identification Verified: Yes Signs or symptoms of abuse/neglect since last No Secondary Verification Process Yes visito Completed: Hospitalized since last visit: No Patient Has Alerts: Yes Pain Present Now: No Electronic Signature(s) Signed: 05/25/2015 12:36:32 PM By: Lorine Bears RCP, RRT, CHT Entered By: Lorine Bears on 05/25/2015 10:20:11 Surratt, [name] ([id number]) -------------------------------------------------------------------------------- Encounter Discharge Information Details Patient Name: [name] Date of Service: 05/25/2015 10:00 AM Medical Record Number: [id number] Patient Account Number: [id number] Date of Birth/Sex: 21-Jun-1920 (81 y.o. Male) Treating RN: Primary Care Physician: Frazier Richards Other Clinician: Jacqulyn Bath Referring Physician: Frazier Richards Treating Physician/Extender: Frann Rider in Treatment: 4 Encounter Discharge Information Items Discharge Pain Level: 0 Discharge Condition: Stable Ambulatory Status: Ambulatory Discharge Destination: Home Private Transportation: Auto Accompanied By: wife Schedule Follow-up  Appointment: No Medication Reconciliation completed and No provided to Patient/Care Carsyn Boster: Clinical Summary of Care: Notes Patient has an HBO treatment scheduled on 05/26/15 at 10:00 am. Electronic Signature(s) Signed: 05/25/2015 12:36:32 PM By: Lorine Bears RCP, RRT, CHT Entered By: Becky Sax, Amado Nash on 05/25/2015 12:36:13 [place]land, [name] ([id number]) -------------------------------------------------------------------------------- Vitals Details Patient Name: [name] Date of Service: 05/25/2015 10:00 AM Medical Record Number: [id number] Patient Account Number: [id number] Date of Birth/Sex: 1920/03/19 (81 y.o. Male) Treating RN: Primary Care Physician: Frazier Richards Other Clinician: Jacqulyn Bath Referring Physician: Frazier Richards Treating Physician/Extender: Frann Rider in Treatment: 4 Vital Signs Time Taken: 09:55 Temperature (F): 97.6 Height (in): 67 Pulse (bpm): 54 Weight (lbs): 116.8 Respiratory Rate (breaths/min): 18 Body Mass Index (BMI): 18.3 Blood Pressure (mmHg): 122/68 Reference Range: 80 - 120 mg / dl Electronic Signature(s) Signed: 05/25/2015 12:36:32 PM By: Lorine Bears RCP, RRT, CHT Entered By: Lorine Bears on 05/25/2015 10:20:53

## 2015-05-25 NOTE — Progress Notes (Signed)
[name], [name] ([id number]) Visit Report for 05/25/2015 HBO Details Patient Name: [name], [name] Date of Service: 05/25/2015 10:00 AM Medical Record Number: [id number] Patient Account Number: [id number] Date of Birth/Sex: [birth year] (81 y.o. Male) Treating RN: Primary Care Physician: Frazier Richards Other Clinician: Jacqulyn Bath Referring Physician: Frazier Richards Treating Physician/Extender: Frann Rider in Treatment: 4 HBO Treatment Course Details Treatment Course Ordering Physician: Christin Fudge 1 Number: HBO Treatment Start Date: 05/05/2015 Total Treatments 40 Ordered: HBO Indication: Late Effect of Radiation HBO Treatment Details Treatment Number: 13 Patient Type: Outpatient Chamber Type: Monoplace Chamber #: HBO #[id number] Treatment Protocol: 2.0 ATA with 90 minutes oxygen, and no air breaks Treatment Details Compression Rate Down: 1.5 psi / minute De-Compression Rate Up: 1.5 psi / minute Air breaks and breathing Compress Tx Pressure Decompress Decompress periods Begins Reached Begins Ends (leave unused spaces blank) Chamber Pressure 1 ATA 2.0 ATA - - - - - - 2.0 ATA 1 ATA Clock Time (24 hr) 10:12 10:22 - - - - - - 11:52 12:02 Treatment Length: 110 (minutes) Treatment Segments: 4 Capillary Blood Glucose Pre Capillary Blood Glucose (mg/dl): Post Capillary Blood Glucose (mg/dl): Vital Signs Capillary Blood Glucose Reference Range: 80 - 120 mg / dl HBO Diabetic Blood Glucose Intervention Range: <131 mg/dl or >249 mg/dl Time Vitals Blood Respiratory Capillary Blood Glucose Pulse Action Type: Pulse: Temperature: Taken: Pressure: Rate: Glucose (mg/dl): Meter #: Oximetry (%) Taken: Pre 09:55 122/68 54 18 97.6 Post 12:00 112/64 60 18 98 Treatment Response Treatment Completion Status: Treatment Completed without Adverse Event [name], [name] ([id number]) HBO Attestation I certify that I supervised this HBO treatment in accordance with Medicare  guidelines. A trained Yes emergency response team is readily available per hospital policies and procedures. Continue HBOT as ordered. Yes Electronic Signature(s) Signed: 05/25/2015 3:00:47 PM By: Christin Fudge MD, FACS Previous Signature: 05/25/2015 12:36:32 PM Version By: Lorine Bears RCP, RRT, CHT Entered By: Christin Fudge on 05/25/2015 15:00:47 [name], [name] Kitchen ([id number]) -------------------------------------------------------------------------------- HBO Safety Checklist Details Patient Name: [name] Date of Service: 05/25/2015 10:00 AM Medical Record Number: [id number] Patient Account Number: [id number] Date of Birth/Sex: [birth year] (81 y.o. Male) Treating RN: Primary Care Physician: Frazier Richards Other Clinician: Jacqulyn Bath Referring Physician: Frazier Richards Treating Physician/Extender: Frann Rider in Treatment: 4 HBO Safety Checklist Items Safety Checklist Consent Form Signed Patient voided / foley secured and emptied When did you last eato 07:15 am Last dose of injectable or oral agent n/a NA Ostomy pouch emptied and vented if applicable NA All implantable devices assessed, documented and approved NA Intravenous access site secured and place Valuables secured Linens and cotton and cotton/polyester blend (less than 51% polyester) Personal oil-based products / skin lotions / body lotions removed Wigs or hairpieces removed Smoking or tobacco materials removed Books / newspapers / magazines / loose paper removed Cologne, aftershave, perfume and deodorant removed Jewelry removed (may wrap wedding band) Make-up removed Hair care products removed Battery operated devices (external) removed Heating patches and chemical warmers removed NA Titanium eyewear removed NA Nail polish cured greater than 10 hours NA Casting material cured greater than 10 hours Hearing aids removed Loose dentures or partials removed NA Prosthetics have  been removed Patient demonstrates correct use of air break device (if applicable) Patient concerns have been addressed Patient grounding bracelet on and cord attached to chamber Specifics for Inpatients (complete in addition to above) Medication sheet sent with patient Intravenous medications needed or due during therapy sent with patient [name], [name] ([id number]) Drainage tubes (e.g. nasogastric tube or chest tube secured and vented) Endotracheal or Tracheotomy tube secured Cuff deflated of air and inflated with saline Airway suctioned Electronic Signature(s) Signed: 05/25/2015 12:36:32 PM By: Lorine Bears RCP, RRT, CHT Entered By: Becky Sax, Amado Nash on 05/25/2015 10:21:41

## 2015-05-26 ENCOUNTER — Encounter: Payer: Medicare Other | Admitting: Surgery

## 2015-05-26 DIAGNOSIS — N3041 Irradiation cystitis with hematuria: Secondary | ICD-10-CM | POA: Diagnosis not present

## 2015-05-26 NOTE — Progress Notes (Signed)
[name], [name] ([id number]) Visit Report for 05/26/2015 Arrival Information Details Patient Name: [name], [name] Date of Service: 05/26/2015 10:00 AM Medical Record Number: [id number] Patient Account Number: [id number] Date of Birth/Sex: 1920/04/13 (81 y.o. Male) Treating RN: Primary Care Physician: Frazier Richards Other Clinician: Jacqulyn Bath Referring Physician: Frazier Richards Treating Physician/Extender: Frann Rider in Treatment: 4 Visit Information History Since Last Visit Added or deleted any medications: No Patient Arrived: Ambulatory Any new allergies or adverse reactions: No Arrival Time: 09:35 Had a fall or experienced change in No Accompanied By: wife activities of daily living that may affect Transfer Assistance: Manual risk of falls: Patient Identification Verified: Yes Signs or symptoms of abuse/neglect since last No Secondary Verification Process Yes visito Completed: Hospitalized since last visit: No Patient Has Alerts: Yes Pain Present Now: No Electronic Signature(s) Signed: 05/26/2015 3:32:39 PM By: Lorine Bears RCP, RRT, CHT Entered By: Becky Sax, Amado Nash on 05/26/2015 09:41:44 [name], [name] ([id number]) -------------------------------------------------------------------------------- Encounter Discharge Information Details Patient Name: [name] Date of Service: 05/26/2015 10:00 AM Medical Record Number: [id number] Patient Account Number: [id number] Date of Birth/Sex: 09-09-1919 (81 y.o. Male) Treating RN: Primary Care Physician: Frazier Richards Other Clinician: Jacqulyn Bath Referring Physician: Frazier Richards Treating Physician/Extender: Frann Rider in Treatment: 4 Encounter Discharge Information Items Discharge Pain Level: 0 Discharge Condition: Stable Ambulatory Status: Ambulatory Discharge Destination: Home Private Transportation: Auto Accompanied By: wife Schedule Follow-up  Appointment: No Medication Reconciliation completed and No provided to Patient/Care Erlinda Solinger: Clinical Summary of Care: Notes Patient has an HBO treatment scheduled on 05/27/15 at 10:00 am. Electronic Signature(s) Signed: 05/26/2015 3:32:39 PM By: Lorine Bears RCP, RRT, CHT Entered By: Lorine Bears on 05/26/2015 12:06:17 [name], [name] ([id number]) -------------------------------------------------------------------------------- Vitals Details Patient Name: [name] Date of Service: 05/26/2015 10:00 AM Medical Record Number: [id number] Patient Account Number: [id number] Date of Birth/Sex: [birth year] (81 y.o. Male) Treating RN: Primary Care Physician: Frazier Richards Other Clinician: Jacqulyn Bath Referring Physician: Frazier Richards Treating Physician/Extender: Frann Rider in Treatment: 4 Vital Signs Time Taken: 09:50 Temperature (F): 97.8 Height (in): 67 Pulse (bpm): 66 Weight (lbs): 116.8 Respiratory Rate (breaths/min): 18 Body Mass Index (BMI): 18.3 Blood Pressure (mmHg): 122/76 Reference Range: 80 - 120 mg / dl Electronic Signature(s) Signed: 05/26/2015 3:32:39 PM By: Lorine Bears RCP, RRT, CHT Entered By: Becky Sax, Amado Nash on 05/26/2015 09:53:08

## 2015-05-26 NOTE — Progress Notes (Signed)
[name], [name] ([id number]) Visit Report for 05/26/2015 HBO Details Patient Name: [name], [name] Date of Service: 05/26/2015 10:00 AM Medical Record Number: [id number] Patient Account Number: [id number] Date of Birth/Sex: 07-Aug-1920 (81 y.o. Male) Treating RN: Primary Care Physician: Frazier Richards Other Clinician: Jacqulyn Bath Referring Physician: Frazier Richards Treating Physician/Extender: Frann Rider in Treatment: 4 HBO Treatment Course Details Treatment Course Ordering Physician: Christin Fudge 1 Number: HBO Treatment Start Date: 05/05/2015 Total Treatments 40 Ordered: HBO Indication: Late Effect of Radiation HBO Treatment Details Treatment Number: 14 Patient Type: Outpatient Chamber Type: Monoplace Chamber #: HBO #[id number] Treatment Protocol: 2.0 ATA with 90 minutes oxygen, and no air breaks Treatment Details Compression Rate Down: 1.5 psi / minute De-Compression Rate Up: 1.5 psi / minute Air breaks and breathing Compress Tx Pressure Decompress Decompress periods Begins Reached Begins Ends (leave unused spaces blank) Chamber Pressure 1 ATA 2.0 ATA - - - - - - 2.0 ATA 1 ATA Clock Time (24 hr) 10:07 10:19 - - - - - - 11:49 12:00 Treatment Length: 113 (minutes) Treatment Segments: 4 Capillary Blood Glucose Pre Capillary Blood Glucose (mg/dl): Post Capillary Blood Glucose (mg/dl): Vital Signs Capillary Blood Glucose Reference Range: 80 - 120 mg / dl HBO Diabetic Blood Glucose Intervention Range: <131 mg/dl or >249 mg/dl Time Vitals Blood Respiratory Capillary Blood Glucose Pulse Action Type: Pulse: Temperature: Taken: Pressure: Rate: Glucose (mg/dl): Meter #: Oximetry (%) Taken: Pre 09:50 122/76 66 18 97.8 Post 12:05 120/70 48 18 98 Treatment Response Treatment Completion Status: Treatment Completed without Adverse Event Sundstrom, Rolla W. ([id number]) HBO Attestation I certify that I supervised this HBO treatment in accordance with Medicare  guidelines. A trained Yes emergency response team is readily available per hospital policies and procedures. Continue HBOT as ordered. Yes Electronic Signature(s) Signed: 05/26/2015 12:25:42 PM By: Christin Fudge MD, FACS Entered By: Christin Fudge on 05/26/2015 12:25:42 [name], [name] ([id number]) -------------------------------------------------------------------------------- HBO Safety Checklist Details Patient Name: [name] Date of Service: 05/26/2015 10:00 AM Medical Record Number: [id number] Patient Account Number: [id number] Date of Birth/Sex: 10/01/1919 (81 y.o. Male) Treating RN: Primary Care Physician: Frazier Richards Other Clinician: Jacqulyn Bath Referring Physician: Frazier Richards Treating Physician/Extender: Frann Rider in Treatment: 4 HBO Safety Checklist Items Safety Checklist Consent Form Signed Patient voided / foley secured and emptied When did you last eato 05/25/15 pm Last dose of injectable or oral agent n/a NA Ostomy pouch emptied and vented if applicable NA All implantable devices assessed, documented and approved NA Intravenous access site secured and place Valuables secured Linens and cotton and cotton/polyester blend (less than 51% polyester) Personal oil-based products / skin lotions / body lotions removed Wigs or hairpieces removed Smoking or tobacco materials removed Books / newspapers / magazines / loose paper removed Cologne, aftershave, perfume and deodorant removed Jewelry removed (may wrap wedding band) Make-up removed Hair care products removed Battery operated devices (external) removed Heating patches and chemical warmers removed NA Titanium eyewear removed NA Nail polish cured greater than 10 hours NA Casting material cured greater than 10 hours Hearing aids removed Loose dentures or partials removed NA Prosthetics have been removed Patient demonstrates correct use of air break device (if applicable) Patient  concerns have been addressed Patient grounding bracelet on and cord attached to chamber Specifics for Inpatients (complete in addition to above) Medication sheet sent with patient Intravenous medications needed or due during therapy sent with patient [name], MARINOS. ([id number]) Drainage tubes (e.g. nasogastric tube or chest tube secured and vented)  Endotracheal or Tracheotomy tube secured Cuff deflated of air and inflated with saline Airway suctioned Electronic Signature(s) Signed: 05/26/2015 3:32:39 PM By: Lorine Bears RCP, RRT, CHT Entered By: Lorine Bears on 05/26/2015 09:53:55

## 2015-05-27 ENCOUNTER — Encounter: Payer: Medicare Other | Admitting: Surgery

## 2015-05-27 DIAGNOSIS — N3041 Irradiation cystitis with hematuria: Secondary | ICD-10-CM | POA: Diagnosis not present

## 2015-05-27 NOTE — Progress Notes (Signed)
[name] ([id number]) Visit Report for 05/27/2015 Arrival Information Details Patient Name: [name] Date of Service: 05/27/2015 10:00 AM Medical Record Number: [id number] Patient Account Number: [id number] Date of Birth/Sex: May 16, 1920 (81 y.o. Male) Treating RN: Primary Care Physician: Frazier Richards Other Clinician: Jacqulyn Bath Referring Physician: Frazier Richards Treating Physician/Extender: BURNS III, Charlean Sanfilippo in Treatment: 4 Visit Information History Since Last Visit Added or deleted any medications: No Patient Arrived: Ambulatory Any new allergies or adverse reactions: No Arrival Time: 09:35 Had a fall or experienced change in No Accompanied By: wife activities of daily living that may affect Transfer Assistance: Manual risk of falls: Patient Identification Verified: Yes Signs or symptoms of abuse/neglect since last No Secondary Verification Process Yes visito Completed: Pain Present Now: No Patient Has Alerts: Yes Electronic Signature(s) Signed: 05/27/2015 3:26:14 PM By: Lorine Bears RCP, RRT, CHT Entered By: Becky Sax, Amado Nash on 05/27/2015 09:41:20 [name], [place]. ([id number]) -------------------------------------------------------------------------------- Encounter Discharge Information Details Patient Name: [name] Date of Service: 05/27/2015 10:00 AM Medical Record Number: [id number] Patient Account Number: [id number] Date of Birth/Sex: [birth year] (81 y.o. Male) Treating RN: Primary Care Physician: Frazier Richards Other Clinician: Jacqulyn Bath Referring Physician: Frazier Richards Treating Physician/Extender: BURNS III, Charlean Sanfilippo in Treatment: 4 Encounter Discharge Information Items Discharge Pain Level: 0 Discharge Condition: Stable Ambulatory Status: Ambulatory Discharge Destination: Home Private Transportation: Auto Accompanied By: wife Schedule Follow-up Appointment: No Medication  Reconciliation completed and No provided to Patient/Care [name]: Clinical Summary of Care: Notes Patient has an HBO treatment scheduled on 05/28/15 at 10:00 am. Electronic Signature(s) Signed: 05/27/2015 3:26:14 PM By: Lorine Bears RCP, RRT, CHT Entered By: Becky Sax, Amado Nash on 05/27/2015 12:13:31 [name] ([id number]) -------------------------------------------------------------------------------- Vitals Details Patient Name: [name] Date of Service: 05/27/2015 10:00 AM Medical Record Number: [id number] Patient Account Number: [id number] Date of Birth/Sex: 1920/04/11 (81 y.o. Male) Treating RN: Primary Care Physician: Frazier Richards Other Clinician: Jacqulyn Bath Referring Physician: Frazier Richards Treating Physician/Extender: BURNS III, Charlean Sanfilippo in Treatment: 4 Vital Signs Time Taken: 09:35 Temperature (F): 97.1 Height (in): 67 Pulse (bpm): 60 Weight (lbs): 116.8 Respiratory Rate (breaths/min): 18 Body Mass Index (BMI): 18.3 Blood Pressure (mmHg): 114/84 Reference Range: 80 - 120 mg / dl Electronic Signature(s) Signed: 05/27/2015 3:26:14 PM By: Lorine Bears RCP, RRT, CHT Entered By: Lorine Bears on 05/27/2015 09:42:06

## 2015-05-27 NOTE — Progress Notes (Signed)
[name], [name] ([id number]) Visit Report for 05/27/2015 HBO Details Patient Name: [name], [name] Date of Service: 05/27/2015 10:00 AM Medical Record Number: [id number] Patient Account Number: [id number] Date of Birth/Sex: [birth year] (81 y.o. Male) Treating RN: Primary Care Physician: Frazier Richards Other Clinician: Jacqulyn Bath Referring Physician: Frazier Richards Treating Physician/Extender: BURNS III, Charlean Sanfilippo in Treatment: 4 HBO Treatment Course Details Treatment Course Ordering Physician: Christin Fudge 1 Number: HBO Treatment Start Date: 05/05/2015 Total Treatments 40 Ordered: HBO Indication: Late Effect of Radiation HBO Treatment Details Treatment Number: 15 Patient Type: Outpatient Chamber Type: Monoplace Chamber #: [id number]#[id number] Treatment Protocol: 2.0 ATA with 90 minutes oxygen, and no air breaks Treatment Details Compression Rate Down: 1.5 psi / minute De-Compression Rate Up: 1.5 psi / minute Air breaks and breathing Compress Tx Pressure Decompress Decompress periods Begins Reached Begins Ends (leave unused spaces blank) Chamber Pressure 1 ATA 2.0 ATA - - - - - - 2.0 ATA 1 ATA Clock Time (24 hr) 10:07 10:19 - - - - - - 11:49 12:00 Treatment Length: 113 (minutes) Treatment Segments: 4 Capillary Blood Glucose Pre Capillary Blood Glucose (mg/dl): Post Capillary Blood Glucose (mg/dl): Vital Signs Capillary Blood Glucose Reference Range: 80 - 120 mg / dl HBO Diabetic Blood Glucose Intervention Range: <131 mg/dl or >249 mg/dl Time Vitals Blood Respiratory Capillary Blood Glucose Pulse Action Type: Pulse: Temperature: Taken: Pressure: Rate: Glucose (mg/dl): Meter #: Oximetry (%) Taken: Pre 09:35 114/84 60 18 97.1 Post 12:05 110/68 54 18 97.2 Treatment Response Treatment Completion Status: Treatment Completed without Adverse Event Holck, Arrington W. ([id number]) HBO Attestation I certify that I supervised this HBO treatment in accordance with Medicare  guidelines. A trained Yes emergency response team is readily available per hospital policies and procedures. Continue HBOT as ordered. Yes Electronic Signature(s) Signed: 05/27/2015 3:24:17 PM By: Loletha Grayer MD Entered By: Loletha Grayer on 05/27/2015 13:24:43 Golinski, Desi W. ([id number]) -------------------------------------------------------------------------------- HBO Safety Checklist Details Patient Name: [name] Date of Service: 05/27/2015 10:00 AM Medical Record Number: [id number] Patient Account Number: [id number] Date of Birth/Sex: 27-Oct-1919 (81 y.o. Male) Treating RN: Primary Care Physician: Frazier Richards Other Clinician: Jacqulyn Bath Referring Physician: Frazier Richards Treating Physician/Extender: BURNS III, Charlean Sanfilippo in Treatment: 4 HBO Safety Checklist Items Safety Checklist Consent Form Signed Patient voided / foley secured and emptied When did you last eato 07:00 am Last dose of injectable or oral agent n/a NA Ostomy pouch emptied and vented if applicable NA All implantable devices assessed, documented and approved NA Intravenous access site secured and place Valuables secured Linens and cotton and cotton/polyester blend (less than 51% polyester) Personal oil-based products / skin lotions / body lotions removed Wigs or hairpieces removed Smoking or tobacco materials removed Books / newspapers / magazines / loose paper removed Cologne, aftershave, perfume and deodorant removed Jewelry removed (may wrap wedding band) Make-up removed Hair care products removed Battery operated devices (external) removed Heating patches and chemical warmers removed NA Titanium eyewear removed NA Nail polish cured greater than 10 hours NA Casting material cured greater than 10 hours Hearing aids removed Loose dentures or partials removed NA Prosthetics have been removed Patient demonstrates correct use of air break device (if  applicable) Patient concerns have been addressed Patient grounding bracelet on and cord attached to chamber Specifics for Inpatients (complete in addition to above) Medication sheet sent with patient Intravenous medications needed or due during therapy sent with patient [name], [name]. ([id number]) Drainage tubes (e.g. nasogastric tube or chest tube secured  and vented) Endotracheal or Tracheotomy tube secured Cuff deflated of air and inflated with saline Airway suctioned Electronic Signature(s) Signed: 05/27/2015 3:26:14 PM By: Lorine Bears RCP, RRT, CHT Entered By: Lorine Bears on 05/27/2015 09:42:43

## 2015-05-28 ENCOUNTER — Encounter: Payer: Medicare Other | Admitting: Surgery

## 2015-05-28 DIAGNOSIS — N3041 Irradiation cystitis with hematuria: Secondary | ICD-10-CM | POA: Diagnosis not present

## 2015-05-28 NOTE — Progress Notes (Signed)
ZAEL, SHUMAN ([id number]) Visit Report for 05/28/2015 Arrival Information Details Patient Name: DAEKWON, BESWICK Date of Service: 05/28/2015 10:00 AM Medical Record Number: [id number] Patient Account Number: [id number] Date of Birth/Sex: 01-20-1920 (81 y.o. Male) Treating RN: Primary Care Physician: Frazier Richards Other Clinician: Jacqulyn Bath Referring Physician: Frazier Richards Treating Physician/Extender: Frann Rider in Treatment: 5 Visit Information History Since Last Visit Added or deleted any medications: No Patient Arrived: Ambulatory Any new allergies or adverse reactions: No Arrival Time: 09:30 Had a fall or experienced change in No Accompanied By: wwife activities of daily living that may affect Transfer Assistance: Manual risk of falls: Patient Identification Verified: Yes Signs or symptoms of abuse/neglect since last No Secondary Verification Process Yes visito Completed: Hospitalized since last visit: No Patient Has Alerts: Yes Pain Present Now: No Electronic Signature(s) Signed: 05/28/2015 12:07:50 PM By: Lorine Bears RCP, RRT, CHT Entered By: Lorine Bears on 05/28/2015 09:49:28 Pasquariello, [name] ([id number]) -------------------------------------------------------------------------------- Encounter Discharge Information Details Patient Name: [name] Date of Service: 05/28/2015 10:00 AM Medical Record Number: [id number] Patient Account Number: [id number] Date of Birth/Sex: 11-22-1919 (81 y.o. Male) Treating RN: Primary Care Physician: Frazier Richards Other Clinician: Jacqulyn Bath Referring Physician: Frazier Richards Treating Physician/Extender: Frann Rider in Treatment: 5 Encounter Discharge Information Items Discharge Pain Level: 0 Discharge Condition: Stable Ambulatory Status: Ambulatory Discharge Destination: Home Private Transportation: Auto Accompanied By: wife Schedule Follow-up  Appointment: No Medication Reconciliation completed and No provided to Patient/Care Kamali Sakata: Clinical Summary of Care: Notes Patient has an HBO treatment scheduled on 05/29/15 at 10:00 am. Electronic Signature(s) Signed: 05/28/2015 12:07:50 PM By: Lorine Bears RCP, RRT, CHT Entered By: Lorine Bears on 05/28/2015 12:05:21 [name] ([id number]) -------------------------------------------------------------------------------- Vitals Details Patient Name: [name] Date of Service: 05/28/2015 10:00 AM Medical Record Number: [id number] Patient Account Number: [id number] Date of Birth/Sex: [birth year] (81 y.o. Male) Treating RN: Primary Care Physician: Frazier Richards Other Clinician: Jacqulyn Bath Referring Physician: Frazier Richards Treating Physician/Extender: Frann Rider in Treatment: 5 Vital Signs Time Taken: 09:38 Temperature (F): 98.3 Height (in): 67 Pulse (bpm): 66 Weight (lbs): 116.8 Respiratory Rate (breaths/min): 18 Body Mass Index (BMI): 18.3 Blood Pressure (mmHg): 112/68 Reference Range: 80 - 120 mg / dl Electronic Signature(s) Signed: 05/28/2015 12:07:50 PM By: Lorine Bears RCP, RRT, CHT Entered By: Lorine Bears on 05/28/2015 09:49:55

## 2015-05-29 ENCOUNTER — Encounter (HOSPITAL_BASED_OUTPATIENT_CLINIC_OR_DEPARTMENT_OTHER): Payer: Medicare Other | Admitting: General Surgery

## 2015-05-29 DIAGNOSIS — L599 Disorder of the skin and subcutaneous tissue related to radiation, unspecified: Secondary | ICD-10-CM | POA: Diagnosis not present

## 2015-05-29 DIAGNOSIS — L598 Other specified disorders of the skin and subcutaneous tissue related to radiation: Secondary | ICD-10-CM

## 2015-05-29 DIAGNOSIS — N3041 Irradiation cystitis with hematuria: Secondary | ICD-10-CM | POA: Diagnosis not present

## 2015-05-29 NOTE — Progress Notes (Signed)
[name], [name] ([id number]) Visit Report for 05/29/2015 HBO Details Patient Name: [name], [name]. Date of Service: 05/29/2015 10:00 AM Medical Record Number: [id number] Patient Account Number: [id number] Date of Birth/Sex: August 18, 1919 (81 y.o. Male) Treating RN: Primary Care Physician: Frazier Richards Other Clinician: Jacqulyn Bath Referring Physician: Frazier Richards Treating Physician/Extender: Benjaman Pott in Treatment: 5 HBO Treatment Course Details Treatment Course Ordering Physician: Christin Fudge 1 Number: HBO Treatment Start Date: 05/05/2015 Total Treatments 40 Ordered: HBO Indication: Late Effect of Radiation HBO Treatment Details Treatment Number: 17 Patient Type: Outpatient Chamber Type: Monoplace Chamber #: [id number] Treatment Protocol: 2.0 ATA with 90 minutes oxygen, and no air breaks Treatment Details Compression Rate Down: 1.5 psi / minute De-Compression Rate Up: 1.5 psi / minute Air breaks and breathing Compress Tx Pressure Decompress Decompress periods Begins Reached Begins Ends (leave unused spaces blank) Chamber Pressure 1 ATA 2.0 ATA - - - - - - 2.0 ATA 1 ATA Clock Time (24 hr) 09:40 09:51 - - - - - - 11:21 11:31 Treatment Length: 111 (minutes) Treatment Segments: 4 Capillary Blood Glucose Pre Capillary Blood Glucose (mg/dl): Post Capillary Blood Glucose (mg/dl): Vital Signs Capillary Blood Glucose Reference Range: 80 - 120 mg / dl HBO Diabetic Blood Glucose Intervention Range: <131 mg/dl or >249 mg/dl Time Vitals Blood Respiratory Capillary Blood Glucose Pulse Action Type: Pulse: Temperature: Taken: Pressure: Rate: Glucose (mg/dl): Meter #: Oximetry (%) Taken: Pre 09:25 110/68 66 18 97.9 Post 11:33 114/64 78 18 97.3 Treatment Response Treatment Completion Status: Treatment Completed without Adverse Event Viar, Eswin W. ([id number]) HBO Attestation I certify that I supervised this HBO treatment in accordance with Medicare  guidelines. A trained Yes emergency response team is readily available per hospital policies and procedures. Continue HBOT as ordered. Yes Electronic Signature(s) Signed: 05/29/2015 12:15:17 PM By: Judene Companion MD Entered By: Judene Companion on 05/29/2015 12:15:17 [place], [name] ([id number]) -------------------------------------------------------------------------------- HBO Safety Checklist Details Patient Name: [name] Date of Service: 05/29/2015 10:00 AM Medical Record Number: [id number] Patient Account Number: [id number] Date of Birth/Sex: 06-15-1920 (81 y.o. Male) Treating RN: Primary Care Physician: Frazier Richards Other Clinician: Jacqulyn Bath Referring Physician: Frazier Richards Treating Physician/Extender: Benjaman Pott in Treatment: 5 HBO Safety Checklist Items Safety Checklist Consent Form Signed Patient voided / foley secured and emptied When did you last eato 07:30 am Last dose of injectable or oral agent n/a NA Ostomy pouch emptied and vented if applicable NA All implantable devices assessed, documented and approved NA Intravenous access site secured and place Valuables secured Linens and cotton and cotton/polyester blend (less than 51% polyester) Personal oil-based products / skin lotions / body lotions removed Wigs or hairpieces removed Smoking or tobacco materials removed Books / newspapers / magazines / loose paper removed Cologne, aftershave, perfume and deodorant removed Jewelry removed (may wrap wedding band) Make-up removed Hair care products removed Battery operated devices (external) removed Heating patches and chemical warmers removed NA Titanium eyewear removed NA Nail polish cured greater than 10 hours NA Casting material cured greater than 10 hours Hearing aids removed Loose dentures or partials removed NA Prosthetics have been removed Patient demonstrates correct use of air break device (if applicable) Patient concerns  have been addressed Patient grounding bracelet on and cord attached to chamber Specifics for Inpatients (complete in addition to above) Medication sheet sent with patient Intravenous medications needed or due during therapy sent with patient ROSHAUN, POUND. ([id number]) Drainage tubes (e.g. nasogastric tube or chest tube secured and vented) Endotracheal or  Tracheotomy tube secured Cuff deflated of air and inflated with saline Airway suctioned Electronic Signature(s) Signed: 05/29/2015 12:14:57 PM By: Judene Companion MD Entered By: Judene Companion on 05/29/2015 12:14:57

## 2015-05-29 NOTE — Progress Notes (Signed)
seeiheal 

## 2015-05-29 NOTE — Progress Notes (Signed)
NOELL, SHULAR ([id number]) Visit Report for 05/29/2015 Arrival Information Details Patient Name: [name], [name] Date of Service: 05/29/2015 10:00 AM Medical Record Number: [id number] Patient Account Number: [id number] Date of Birth/Sex: 1920-01-01 (81 y.o. Male) Treating RN: Primary Care Physician: Frazier Richards Other Clinician: Jacqulyn Bath Referring Physician: Frazier Richards Treating Physician/Extender: Benjaman Pott in Treatment: 5 Visit Information History Since Last Visit Added or deleted any medications: No Patient Arrived: Ambulatory Any new allergies or adverse reactions: No Arrival Time: 09:25 Had a fall or experienced change in No Accompanied By: wife activities of daily living that may affect Transfer Assistance: Manual risk of falls: Patient Identification Verified: Yes Signs or symptoms of abuse/neglect since last No Secondary Verification Process Yes visito Completed: Hospitalized since last visit: No Patient Has Alerts: Yes Pain Present Now: No Electronic Signature(s) Signed: 05/29/2015 12:48:40 PM By: Lorine Bears RCP, RRT, CHT Entered By: Becky Sax, Amado Nash on 05/29/2015 09:42:56 Lehn, Whitaker W. ([id number]) -------------------------------------------------------------------------------- Encounter Discharge Information Details Patient Name: [name] Date of Service: 05/29/2015 10:00 AM Medical Record Number: [id number] Patient Account Number: [id number] Date of Birth/Sex: 1919-10-11 (81 y.o. Male) Treating RN: Primary Care Physician: Frazier Richards Other Clinician: Jacqulyn Bath Referring Physician: Frazier Richards Treating Physician/Extender: Benjaman Pott in Treatment: 5 Encounter Discharge Information Items Discharge Pain Level: 0 Discharge Condition: Stable Ambulatory Status: Ambulatory Discharge Destination: Home Private Transportation: Auto Accompanied By: wife Schedule Follow-up  Appointment: No Medication Reconciliation completed and No provided to Patient/Care Beuford Garcilazo: Clinical Summary of Care: Notes Patient has an HBO treatment scheduled on 06/01/15 at 10:00 am. Electronic Signature(s) Signed: 05/29/2015 12:16:15 PM By: Judene Companion MD Entered By: Judene Companion on 05/29/2015 12:16:15 [place], [name] ([id number]) -------------------------------------------------------------------------------- Patient/Caregiver Education Details Patient Name: [name] Date of Service: 05/29/2015 10:00 AM Medical Record Number: [id number] Patient Account Number: [id number] Date of Birth/Gender: 1919-09-24 (81 y.o. Male) Treating RN: Primary Care Physician: Frazier Richards Other Clinician: Jacqulyn Bath Referring Physician: Frazier Richards Treating Physician/Extender: Benjaman Pott in Treatment: 5 Education Assessment Education Provided To: Patient Education Topics Provided Electronic Signature(s) Signed: 05/29/2015 12:16:08 PM By: Judene Companion MD Entered By: Judene Companion on 05/29/2015 12:16:08 [name] ([id number]) -------------------------------------------------------------------------------- Vitals Details Patient Name: [name] Date of Service: 05/29/2015 10:00 AM Medical Record Number: [id number] Patient Account Number: [id number] Date of Birth/Sex: [birth year] (81 y.o. Male) Treating RN: Primary Care Physician: Frazier Richards Other Clinician: Jacqulyn Bath Referring Physician: Frazier Richards Treating Physician/Extender: Benjaman Pott in Treatment: 5 Vital Signs Time Taken: 09:25 Temperature (F): 97.9 Height (in): 67 Pulse (bpm): 66 Weight (lbs): 116.8 Respiratory Rate (breaths/min): 18 Body Mass Index (BMI): 18.3 Blood Pressure (mmHg): 110/68 Reference Range: 80 - 120 mg / dl Electronic Signature(s) Signed: 05/29/2015 12:48:40 PM By: Lorine Bears RCP, RRT, CHT Entered By: Lorine Bears on 05/29/2015 09:46:01

## 2015-05-29 NOTE — Progress Notes (Signed)
LEEUM, SANKEY ([id number]) Visit Report for 05/29/2015 Physician Orders Details Patient Name: [name], [name] Date of Service: 05/29/2015 10:00 AM Medical Record Number: [id number] Patient Account Number: [id number] Date of Birth/Sex: Mar 29, 1920 (81 y.o. 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 Facility Procedures CPT4 Code: 38937342 Description: (Facility Use Only) HBOT, full body chamber, 34min Modifier: Quantity: 4 Physician Procedures CPT4 Code: 8768115 Description: 72620 - WC PHYS LEVEL 1 EST PT ICD-10 Description Diagnosis N30.41 Irradiation cystitis with hematuria Modifier: Quantity: 1 CPT4 Code: 3559741 Description: 63845 - WC PHYS HYPERBARIC OXYGEN THERAPY ICD-10 Description Diagnosis N30.41 Irradiation cystitis with hematuria Modifier: Quantity: 1 Electronic Signature(s) Signed: 05/29/2015 12:15:40 PM By: Judene Companion MD Entered By: Judene Companion on 05/29/2015 12:15:40

## 2015-05-29 NOTE — Progress Notes (Signed)
[name], [name] ([id number]) Visit Report for 05/28/2015 HBO Details Patient Name: [name], [name] Date of Service: 05/28/2015 10:00 AM Medical Record Number: [id number] Patient Account Number: [id number] Date of Birth/Sex: [birth year] (81 y.o. Male) Treating RN: Primary Care Physician: Frazier Richards Other Clinician: Jacqulyn Bath Referring Physician: Frazier Richards Treating Physician/Extender: Frann Rider in Treatment: 5 HBO Treatment Course Details Treatment Course Ordering Physician: Christin Fudge 1 Number: HBO Treatment Start Date: 05/05/2015 Total Treatments 40 Ordered: HBO Indication: Late Effect of Radiation HBO Treatment Details Treatment Number: 16 Patient Type: Outpatient Chamber Type: Monoplace Chamber #: HBO #[id number] Treatment Protocol: 2.0 ATA with 90 minutes oxygen, and no air breaks Treatment Details Compression Rate Down: 1.5 psi / minute De-Compression Rate Up: 1.5 psi / minute Air breaks and breathing Compress Tx Pressure Decompress Decompress periods Begins Reached Begins Ends (leave unused spaces blank) Chamber Pressure 1 ATA 2.0 ATA - - - - - - 2.0 ATA 1 ATA Clock Time (24 hr) 09:48 09:58 - - - - - - 11:28 11:38 Treatment Length: 110 (minutes) Treatment Segments: 4 Capillary Blood Glucose Pre Capillary Blood Glucose (mg/dl): Post Capillary Blood Glucose (mg/dl): Vital Signs Capillary Blood Glucose Reference Range: 80 - 120 mg / dl HBO Diabetic Blood Glucose Intervention Range: <131 mg/dl or >249 mg/dl Time Vitals Blood Respiratory Capillary Blood Glucose Pulse Action Type: Pulse: Temperature: Taken: Pressure: Rate: Glucose (mg/dl): Meter #: Oximetry (%) Taken: Pre 09:38 112/68 66 18 98.3 Post 11:40 118/70 48 18 97.6 Treatment Response Treatment Completion Status: Treatment Completed without Adverse Event [name], [name] ([id number]) HBO Attestation I certify that I supervised this HBO treatment in accordance with Medicare  guidelines. A trained Yes emergency response team is readily available per hospital policies and procedures. Continue HBOT as ordered. Yes Electronic Signature(s) Signed: 05/28/2015 1:33:34 PM By: Christin Fudge MD, FACS Previous Signature: 05/28/2015 12:07:50 PM Version By: Lorine Bears RCP, RRT, CHT Entered By: Christin Fudge on 05/28/2015 13:33:34 [name], [name] ([id number]) -------------------------------------------------------------------------------- HBO Safety Checklist Details Patient Name: [name] Date of Service: 05/28/2015 10:00 AM Medical Record Number: [id number] Patient Account Number: [id number] Date of Birth/Sex: Aug 13, 1919 (81 y.o. Male) Treating RN: Primary Care Physician: Frazier Richards Other Clinician: Jacqulyn Bath Referring Physician: Frazier Richards Treating Physician/Extender: Frann Rider in Treatment: 5 HBO Safety Checklist Items Safety Checklist Consent Form Signed Patient voided / foley secured and emptied When did you last eato 07:30 am Last dose of injectable or oral agent n/a NA Ostomy pouch emptied and vented if applicable NA All implantable devices assessed, documented and approved NA Intravenous access site secured and place Valuables secured Linens and cotton and cotton/polyester blend (less than 51% polyester) Personal oil-based products / skin lotions / body lotions removed Wigs or hairpieces removed Smoking or tobacco materials removed Books / newspapers / magazines / loose paper removed Cologne, aftershave, perfume and deodorant removed Jewelry removed (may wrap wedding band) Make-up removed Hair care products removed Battery operated devices (external) removed Heating patches and chemical warmers removed NA Titanium eyewear removed NA Nail polish cured greater than 10 hours NA Casting material cured greater than 10 hours Hearing aids removed Loose dentures or partials removed NA Prosthetics have  been removed Patient demonstrates correct use of air break device (if applicable) Patient concerns have been addressed Patient grounding bracelet on and cord attached to chamber Specifics for Inpatients (complete in addition to above) Medication sheet sent with patient Intravenous medications needed or due during therapy sent with patient Steib, Lief  W. ([id number]) Drainage tubes (e.g. nasogastric tube or chest tube secured and vented) Endotracheal or Tracheotomy tube secured Cuff deflated of air and inflated with saline Airway suctioned Electronic Signature(s) Signed: 05/28/2015 12:07:50 PM By: Lorine Bears RCP, RRT, CHT Entered By: Lorine Bears on 05/28/2015 10:06:05

## 2015-06-01 ENCOUNTER — Encounter: Payer: Medicare Other | Admitting: Surgery

## 2015-06-02 ENCOUNTER — Encounter: Payer: Medicare Other | Admitting: Surgery

## 2015-06-02 DIAGNOSIS — N3041 Irradiation cystitis with hematuria: Secondary | ICD-10-CM | POA: Diagnosis not present

## 2015-06-03 ENCOUNTER — Encounter: Payer: Medicare Other | Admitting: Surgery

## 2015-06-03 NOTE — Progress Notes (Signed)
ADAEL, CULBREATH ([id number]) Visit Report for 06/02/2015 Arrival Information Details Patient Name: DESIDERIO, DOLATA Date of Service: 06/02/2015 10:00 AM Medical Record Number: [id number] Patient Account Number: [id number] Date of Birth/Sex: 26-Dec-1919 (81 y.o. Male) Treating RN: Primary Care Physician: Frazier Richards Other Clinician: Jacqulyn Bath Referring Physician: Frazier Richards Treating Physician/Extender: Frann Rider in Treatment: 5 Visit Information History Since Last Visit Added or deleted any medications: No Patient Arrived: Ambulatory Any new allergies or adverse reactions: No Arrival Time: 09:30 Had a fall or experienced change in No Accompanied By: self activities of daily living that may affect Transfer Assistance: Manual risk of falls: Patient Identification Verified: Yes Signs or symptoms of abuse/neglect since last No Secondary Verification Process Yes visito Completed: Hospitalized since last visit: No Patient Has Alerts: Yes Pain Present Now: No Electronic Signature(s) Signed: 06/02/2015 4:44:57 PM By: Lorine Bears RCP, RRT, CHT Entered By: Becky Sax, Amado Nash on 06/02/2015 09:35:45 Corprew, Chukwuemeka W. ([id number]) -------------------------------------------------------------------------------- Encounter Discharge Information Details Patient Name: [name] Date of Service: 06/02/2015 10:00 AM Medical Record Number: [id number] Patient Account Number: [id number] Date of Birth/Sex: October 26, 1919 (81 y.o. Male) Treating RN: Primary Care Physician: Frazier Richards Other Clinician: Jacqulyn Bath Referring Physician: Frazier Richards Treating Physician/Extender: Frann Rider in Treatment: 5 Encounter Discharge Information Items Discharge Pain Level: 0 Discharge Condition: Stable Ambulatory Status: Ambulatory Discharge Destination: Home Private Transportation: Auto Accompanied By: self Schedule Follow-up  Appointment: No Medication Reconciliation completed and No provided to Patient/Care Chrisa Hassan: Clinical Summary of Care: Notes Patient has an HBO treatment scheduled on 06/04/15 at 10:00 am. Electronic Signature(s) Signed: 06/02/2015 4:44:57 PM By: Lorine Bears RCP, RRT, CHT Entered By: Becky Sax, Amado Nash on 06/02/2015 11:57:41 Mccabe, Perry Viona Gilmore ([id number]) -------------------------------------------------------------------------------- [place] Details Patient Name: [name] Date of Service: 06/02/2015 10:00 AM Medical Record Number: [id number] Patient Account Number: [id number] Date of Birth/Sex: Apr 03, 1920 (81 y.o. Male) Treating RN: Primary Care Physician: Frazier Richards Other Clinician: Jacqulyn Bath Referring Physician: Frazier Richards Treating Physician/Extender: Frann Rider in Treatment: 5 Vital Signs Time Taken: 09:30 Temperature (F): 97.3 Height (in): 67 Pulse (bpm): 96 Weight (lbs): 116.8 Respiratory Rate (breaths/min): 18 Body Mass Index (BMI): 18.3 Blood Pressure (mmHg): 122/70 Reference Range: 80 - 120 mg / dl Electronic Signature(s) Signed: 06/02/2015 4:44:57 PM By: Lorine Bears RCP, RRT, CHT Entered By: Lorine Bears on 06/02/2015 09:36:11

## 2015-06-03 NOTE — Progress Notes (Signed)
[name], [name] ([id number]) Visit Report for 06/02/2015 HBO Details Patient Name: [name], [name] Date of Service: 06/02/2015 10:00 AM Medical Record Number: [id number] Patient Account Number: [id number] Date of Birth/Sex: 1920/03/02 (81 y.o. Male) Treating RN: Primary Care Physician: Frazier Richards Other Clinician: Jacqulyn Bath Referring Physician: Frazier Richards Treating Physician/Extender: Frann Rider in Treatment: 5 HBO Treatment Course Details Treatment Course Ordering Physician: Christin Fudge 1 Number: HBO Treatment Start Date: 05/05/2015 Total Treatments 40 Ordered: HBO Indication: Late Effect of Radiation HBO Treatment Details Treatment Number: 18 Patient Type: Outpatient Chamber Type: Monoplace Chamber #: TAV#[id number] Treatment Protocol: 2.0 ATA with 90 minutes oxygen, and no air breaks Treatment Details Compression Rate Down: 1.5 psi / minute De-Compression Rate Up: 1.5 psi / minute Air breaks and breathing Compress Tx Pressure Decompress Decompress periods Begins Reached Begins Ends (leave unused spaces blank) Chamber Pressure 1 ATA 2.0 ATA - - - - - - 2.0 ATA 1 ATA Clock Time (24 hr) 09:46 09:56 - - - - - - 11:27 11:37 Treatment Length: 111 (minutes) Treatment Segments: 4 Capillary Blood Glucose Pre Capillary Blood Glucose (mg/dl): Post Capillary Blood Glucose (mg/dl): Vital Signs Capillary Blood Glucose Reference Range: 80 - 120 mg / dl HBO Diabetic Blood Glucose Intervention Range: <131 mg/dl or >249 mg/dl Time Vitals Blood Respiratory Capillary Blood Glucose Pulse Action Type: Pulse: Temperature: Taken: Pressure: Rate: Glucose (mg/dl): Meter #: Oximetry (%) Taken: Pre 09:30 122/70 96 18 97.3 Post 11:40 110/70 60 18 98.5 Treatment Response Treatment Completion Status: Treatment Completed without Adverse Event Pavone, Rayson W. ([id number]) HBO Attestation I certify that I supervised this HBO treatment in accordance with Medicare  guidelines. A trained Yes emergency response team is readily available per hospital policies and procedures. Continue HBOT as ordered. Yes Electronic Signature(s) Signed: 06/02/2015 12:36:53 PM By: Christin Fudge MD, FACS Entered By: Christin Fudge on 06/02/2015 12:36:53 Loch, [name] ([id number]) -------------------------------------------------------------------------------- HBO Safety Checklist Details Patient Name: [name] Date of Service: 06/02/2015 10:00 AM Medical Record Number: [id number] Patient Account Number: [id number] Date of Birth/Sex: [birth year] (81 y.o. Male) Treating RN: Primary Care Physician: Frazier Richards Other Clinician: Jacqulyn Bath Referring Physician: Frazier Richards Treating Physician/Extender: Frann Rider in Treatment: 5 HBO Safety Checklist Items Safety Checklist Consent Form Signed Patient voided / foley secured and emptied When did you last eato 07:30 am Last dose of injectable or oral agent n/a NA Ostomy pouch emptied and vented if applicable NA All implantable devices assessed, documented and approved NA Intravenous access site secured and place Valuables secured Linens and cotton and cotton/polyester blend (less than 51% polyester) Personal oil-based products / skin lotions / body lotions removed Wigs or hairpieces removed Smoking or tobacco materials removed Books / newspapers / magazines / loose paper removed Cologne, aftershave, perfume and deodorant removed Jewelry removed (may wrap wedding band) Make-up removed Hair care products removed Battery operated devices (external) removed Heating patches and chemical warmers removed NA Titanium eyewear removed NA Nail polish cured greater than 10 hours NA Casting material cured greater than 10 hours Hearing aids removed Loose dentures or partials removed NA Prosthetics have been removed Patient demonstrates correct use of air break device (if applicable) Patient  concerns have been addressed Patient grounding bracelet on and cord attached to chamber Specifics for Inpatients (complete in addition to above) Medication sheet sent with patient Intravenous medications needed or due during therapy sent with patient KAREE, CHRISTOPHERSON. ([id number]) Drainage tubes (e.g. nasogastric tube or chest tube secured and vented) Endotracheal  or Tracheotomy tube secured Cuff deflated of air and inflated with saline Airway suctioned Electronic Signature(s) Signed: 06/02/2015 4:44:57 PM By: Lorine Bears RCP, RRT, CHT Entered By: Lorine Bears on 06/02/2015 09:36:54

## 2015-06-04 ENCOUNTER — Encounter: Payer: Medicare Other | Admitting: Surgery

## 2015-06-04 DIAGNOSIS — N3041 Irradiation cystitis with hematuria: Secondary | ICD-10-CM | POA: Diagnosis not present

## 2015-06-04 NOTE — Progress Notes (Signed)
[name] [name] [name] [name] ([id number]) Visit Report for 06/04/2015 Arrival Information Details Patient Name: [name] [name] Date of Service: 06/04/2015 10:00 AM Medical Record Number: [id number] Patient Account Number: [id number] Date of Birth/Sex: Dec 11, 1919 (81 y.o. Male) Treating RN: Primary Care Physician: Frazier Richards Other Clinician: Jacqulyn Bath Referring Physician: Frazier Richards Treating Physician/Extender: Frann Rider in Treatment: 6 Visit Information History Since Last Visit Added or deleted any medications: No Patient Arrived: Ambulatory Any new allergies or adverse reactions: No Arrival Time: 09:30 Had a fall or experienced change in No Accompanied By: wife activities of daily living that may affect Transfer Assistance: Manual risk of falls: Patient Identification Verified: Yes Hospitalized since last visit: No Secondary Verification Process Yes Pain Present Now: No Completed: Patient Has Alerts: Yes Electronic Signature(s) Signed: 06/04/2015 3:11:14 PM By: Lorine Bears RCP, RRT, CHT Entered By: Becky Sax, Amado Nash on 06/04/2015 09:55:55 [name] [name] W. ([id number]) -------------------------------------------------------------------------------- Encounter Discharge Information Details Patient Name: [name] [name] Date of Service: 06/04/2015 10:00 AM Medical Record Number: [id number] Patient Account Number: [id number] Date of Birth/Sex: 1920/01/01 (81 y.o. Male) Treating RN: Primary Care Physician: Frazier Richards Other Clinician: Jacqulyn Bath Referring Physician: Frazier Richards Treating Physician/Extender: Frann Rider in Treatment: 6 Encounter Discharge Information Items Discharge Pain Level: 0 Discharge Condition: Stable Ambulatory Status: Ambulatory Discharge Destination: Home Private Transportation: Auto Accompanied By: wife Schedule Follow-up Appointment: No Medication Reconciliation completed  and No provided to Patient/Care Jostin Rue: Clinical Summary of Care: Notes Patient has an HBO treatment scheduled on 06/05/15 at 10:00 am. Electronic Signature(s) Signed: 06/04/2015 3:11:14 PM By: Lorine Bears RCP, RRT, CHT Entered By: Lorine Bears on 06/04/2015 12:09:06 [name] [name] ([id number]) -------------------------------------------------------------------------------- Vitals Details Patient Name: [name] [name] Date of Service: 06/04/2015 10:00 AM Medical Record Number: [id number] Patient Account Number: [id number] Date of Birth/Sex: 1920/03/18 (81 y.o. Male) Treating RN: Primary Care Physician: Frazier Richards Other Clinician: Jacqulyn Bath Referring Physician: Frazier Richards Treating Physician/Extender: Frann Rider in Treatment: 6 Vital Signs Time Taken: 09:30 Temperature (F): 97.0 Height (in): 67 Pulse (bpm): 54 Weight (lbs): 116.8 Respiratory Rate (breaths/min): 18 Body Mass Index (BMI): 18.3 Blood Pressure (mmHg): 122/70 Reference Range: 80 - 120 mg / dl Electronic Signature(s) Signed: 06/04/2015 3:11:14 PM By: Lorine Bears RCP, RRT, CHT Entered By: Lorine Bears on 06/04/2015 09:56:31

## 2015-06-05 ENCOUNTER — Encounter: Payer: Medicare Other | Admitting: Surgery

## 2015-06-05 DIAGNOSIS — N3041 Irradiation cystitis with hematuria: Secondary | ICD-10-CM | POA: Diagnosis not present

## 2015-06-05 NOTE — Progress Notes (Signed)
[name], [name] ([id number]) Visit Report for 06/04/2015 HBO Details Patient Name: [name], [name]. Date of Service: 06/04/2015 10:00 AM Medical Record Number: [id number] Patient Account Number: [id number] Date of Birth/Sex: 1919/12/17 (81 y.o. Male) Treating RN: Primary Care Physician: Frazier Richards Other Clinician: Jacqulyn Bath Referring Physician: Frazier Richards Treating Physician/Extender: Frann Rider in Treatment: 6 HBO Treatment Course Details Treatment Course Ordering Physician: Christin Fudge 1 Number: HBO Treatment Start Date: 05/05/2015 Total Treatments 40 Ordered: HBO Indication: Late Effect of Radiation HBO Treatment Details Treatment Number: 19 Patient Type: Outpatient Chamber Type: Monoplace Chamber #: [id number] Treatment Protocol: 2.0 ATA with 90 minutes oxygen, and no air breaks Treatment Details Compression Rate Down: 1.5 psi / minute De-Compression Rate Up: 1.5 psi / minute Air breaks and breathing Compress Tx Pressure Decompress Decompress periods Begins Reached Begins Ends (leave unused spaces blank) Chamber Pressure 1 ATA 2.0 ATA - - - - - - 2.0 ATA 1 ATA Clock Time (24 hr) 09:44 09:54 - - - - - - 11:24 11:35 Treatment Length: 111 (minutes) Treatment Segments: 4 Capillary Blood Glucose Pre Capillary Blood Glucose (mg/dl): Post Capillary Blood Glucose (mg/dl): Vital Signs Capillary Blood Glucose Reference Range: 80 - 120 mg / dl HBO Diabetic Blood Glucose Intervention Range: <131 mg/dl or >249 mg/dl Time Vitals Blood Respiratory Capillary Blood Glucose Pulse Action Type: Pulse: Temperature: Taken: Pressure: Rate: Glucose (mg/dl): Meter #: Oximetry (%) Taken: Pre 09:30 122/70 54 18 97 Post 11:45 112/62 54 18 97.9 Treatment Response Treatment Completion Status: Treatment Completed without Adverse Event [name], [name] ([id number]) Electronic Signature(s) Signed: 06/04/2015 3:11:14 PM By: Lorine Bears RCP, RRT,  CHT Signed: 06/04/2015 3:59:19 PM By: Christin Fudge MD, FACS Entered By: Lorine Bears on 06/04/2015 12:08:27 [name] ([id number]) -------------------------------------------------------------------------------- HBO Safety Checklist Details Patient Name: [name] Date of Service: 06/04/2015 10:00 AM Medical Record Number: [id number] Patient Account Number: [id number] Date of Birth/Sex: 09-12-1919 (81 y.o. Male) Treating RN: Primary Care Physician: Frazier Richards Other Clinician: Jacqulyn Bath Referring Physician: Frazier Richards Treating Physician/Extender: Frann Rider in Treatment: 6 HBO Safety Checklist Items Safety Checklist Consent Form Signed Patient voided / foley secured and emptied When did you last eato 07:30 am Last dose of injectable or oral agent n/a NA Ostomy pouch emptied and vented if applicable NA All implantable devices assessed, documented and approved NA Intravenous access site secured and place Valuables secured Linens and cotton and cotton/polyester blend (less than 51% polyester) Personal oil-based products / skin lotions / body lotions removed Wigs or hairpieces removed Smoking or tobacco materials removed Books / newspapers / magazines / loose paper removed Cologne, aftershave, perfume and deodorant removed Jewelry removed (may wrap wedding band) Make-up removed Hair care products removed Battery operated devices (external) removed Heating patches and chemical warmers removed NA Titanium eyewear removed NA Nail polish cured greater than 10 hours NA Casting material cured greater than 10 hours Hearing aids removed Loose dentures or partials removed NA Prosthetics have been removed Patient demonstrates correct use of air break device (if applicable) Patient concerns have been addressed Patient grounding bracelet on and cord attached to chamber Specifics for Inpatients (complete in addition to  above) Medication sheet sent with patient Intravenous medications needed or due during therapy sent with patient [name], [name]. ([id number]) Drainage tubes (e.g. nasogastric tube or chest tube secured and vented) Endotracheal or Tracheotomy tube secured Cuff deflated of air and inflated with saline Airway suctioned Electronic Signature(s) Signed: 06/04/2015 3:11:14 PM By: Juleen China,  RCP,RRT,CHT, Sallie RCP, RRT, CHT Entered By: Lorine Bears on 06/04/2015 09:57:08

## 2015-06-05 NOTE — Progress Notes (Signed)
MARQ, REBELLO ([id number]) Visit Report for 06/05/2015 Arrival Information Details Patient Name: [name], [name] Date of Service: 06/05/2015 10:00 AM Medical Record Number: [id number] Patient Account Number: [id number] Date of Birth/Sex: February 07, 1920 (81 y.o. Male) Treating RN: Primary Care Physician: Frazier Richards Other Clinician: Jacqulyn Bath Referring Physician: Frazier Richards Treating Physician/Extender: Frann Rider in Treatment: 6 Visit Information History Since Last Visit Added or deleted any medications: No Patient Arrived: Ambulatory Any new allergies or adverse reactions: No Arrival Time: 09:35 Had a fall or experienced change in No Accompanied By: self activities of daily living that may affect Transfer Assistance: None risk of falls: Patient Identification Verified: Yes Signs or symptoms of abuse/neglect since last No Secondary Verification Process Yes visito Completed: Hospitalized since last visit: No Patient Has Alerts: Yes Pain Present Now: No Electronic Signature(s) Signed: 06/05/2015 3:21:04 PM By: Lorine Bears RCP, RRT, CHT Entered By: Becky Sax, Amado Nash on 06/05/2015 09:52:18 Calamia, Amber Viona Gilmore ([id number]) -------------------------------------------------------------------------------- Encounter Discharge Information Details Patient Name: [name] Date of Service: 06/05/2015 10:00 AM Medical Record Number: [id number] Patient Account Number: [id number] Date of Birth/Sex: Dec 10, 1919 (81 y.o. Male) Treating RN: Primary Care Physician: Frazier Richards Other Clinician: Jacqulyn Bath Referring Physician: Frazier Richards Treating Physician/Extender: Frann Rider in Treatment: 6 Encounter Discharge Information Items Discharge Pain Level: 0 Discharge Condition: Stable Ambulatory Status: Ambulatory Discharge Destination: Home Private Transportation: Auto Accompanied By: self Schedule Follow-up  Appointment: No Medication Reconciliation completed and No provided to Patient/Care Feliciana Narayan: Clinical Summary of Care: Notes Patient has an HBO treatment scheduled on 06/08/15 at 10:00 am. Electronic Signature(s) Signed: 06/05/2015 3:21:04 PM By: Lorine Bears RCP, RRT, CHT Entered By: Becky Sax, Amado Nash on 06/05/2015 11:51:07 Kabler, Jemarion Viona Gilmore ([id number]) -------------------------------------------------------------------------------- Vitals Details Patient Name: [name] Date of Service: 06/05/2015 10:00 AM Medical Record Number: [id number] Patient Account Number: [id number] Date of Birth/Sex: 05-08-1920 (81 y.o. Male) Treating RN: Primary Care Physician: Frazier Richards Other Clinician: Jacqulyn Bath Referring Physician: Frazier Richards Treating Physician/Extender: Frann Rider in Treatment: 6 Vital Signs Time Taken: 09:35 Temperature (F): 97.1 Height (in): 67 Pulse (bpm): 60 Weight (lbs): 116.8 Respiratory Rate (breaths/min): 18 Body Mass Index (BMI): 18.3 Blood Pressure (mmHg): 122/72 Reference Range: 80 - 120 mg / dl Electronic Signature(s) Signed: 06/05/2015 3:21:04 PM By: Lorine Bears RCP, RRT, CHT Entered By: Becky Sax, Amado Nash on 06/05/2015 09:52:42

## 2015-06-05 NOTE — Progress Notes (Signed)
[name] ([id number]) Visit Report for 06/05/2015 HBO Details Patient Name: [name], [name]. Date of Service: 06/05/2015 10:00 AM Medical Record Number: [id number] Patient Account Number: [id number] Date of Birth/Sex: Nov 21, 1919 (81 y.o. Male) Treating RN: Primary Care Physician: Frazier Richards Other Clinician: Jacqulyn Bath Referring Physician: Frazier Richards Treating Physician/Extender: Frann Rider in Treatment: 6 HBO Treatment Course Details Treatment Course Ordering Physician: Christin Fudge 1 Number: HBO Treatment Start Date: 05/05/2015 Total Treatments 40 Ordered: HBO Indication: Late Effect of Radiation HBO Treatment Details Treatment Number: 20 Patient Type: Outpatient Chamber Type: Monoplace Chamber #: [id number]#[id number] Treatment Protocol: 2.0 ATA with 90 minutes oxygen, and no air breaks Treatment Details Compression Rate Down: 1.5 psi / minute De-Compression Rate Up: 1.5 psi / minute Air breaks and breathing Compress Tx Pressure Decompress Decompress periods Begins Reached Begins Ends (leave unused spaces blank) Chamber Pressure 1 ATA 2.0 ATA - - - - - - 2.0 ATA 1 ATA Clock Time (24 hr) 09:50 10:00 - - - - - - 11:31 11:41 Treatment Length: 111 (minutes) Treatment Segments: 4 Capillary Blood Glucose Pre Capillary Blood Glucose (mg/dl): Post Capillary Blood Glucose (mg/dl): Vital Signs Capillary Blood Glucose Reference Range: 80 - 120 mg / dl HBO Diabetic Blood Glucose Intervention Range: <131 mg/dl or >249 mg/dl Time Vitals Blood Respiratory Capillary Blood Glucose Pulse Action Type: Pulse: Temperature: Taken: Pressure: Rate: Glucose (mg/dl): Meter #: Oximetry (%) Taken: Pre 09:35 122/72 60 18 97.1 Post 11:46 132/72 60 18 97.8 Treatment Response Treatment Completion Status: Treatment Completed without Adverse Event Echeverry, Jaremy W. ([id number]) HBO Attestation I certify that I supervised this HBO treatment in accordance with Medicare  guidelines. A trained Yes emergency response team is readily available per hospital policies and procedures. Continue HBOT as ordered. Yes Electronic Signature(s) Signed: 06/05/2015 12:31:40 PM By: Christin Fudge MD, FACS Entered By: Christin Fudge on 06/05/2015 12:31:39 Beirne, [name] ([id number]) -------------------------------------------------------------------------------- HBO Safety Checklist Details Patient Name: [name] Date of Service: 06/05/2015 10:00 AM Medical Record Number: [id number] Patient Account Number: [id number] Date of Birth/Sex: [birth year] (81 y.o. Male) Treating RN: Primary Care Physician: Frazier Richards Other Clinician: Jacqulyn Bath Referring Physician: Frazier Richards Treating Physician/Extender: Frann Rider in Treatment: 6 HBO Safety Checklist Items Safety Checklist Consent Form Signed Patient voided / foley secured and emptied When did you last eato 07:30 am Last dose of injectable or oral agent n/a NA Ostomy pouch emptied and vented if applicable NA All implantable devices assessed, documented and approved NA Intravenous access site secured and place Valuables secured Linens and cotton and cotton/polyester blend (less than 51% polyester) Personal oil-based products / skin lotions / body lotions removed Wigs or hairpieces removed Smoking or tobacco materials removed Books / newspapers / magazines / loose paper removed Cologne, aftershave, perfume and deodorant removed Jewelry removed (may wrap wedding band) Make-up removed Hair care products removed Battery operated devices (external) removed Heating patches and chemical warmers removed NA Titanium eyewear removed NA Nail polish cured greater than 10 hours NA Casting material cured greater than 10 hours Hearing aids removed Loose dentures or partials removed NA Prosthetics have been removed Patient demonstrates correct use of air break device (if applicable) Patient  concerns have been addressed Patient grounding bracelet on and cord attached to chamber Specifics for Inpatients (complete in addition to above) Medication sheet sent with patient Intravenous medications needed or due during therapy sent with patient KHAMARION, BJELLAND. ([id number]) Drainage tubes (e.g. nasogastric tube or chest tube secured and vented) Endotracheal  or Tracheotomy tube secured Cuff deflated of air and inflated with saline Airway suctioned Electronic Signature(s) Signed: 06/05/2015 3:21:04 PM By: Lorine Bears RCP, RRT, CHT Entered By: Lorine Bears on 06/05/2015 09:53:19

## 2015-06-08 ENCOUNTER — Encounter: Payer: Medicare Other | Admitting: Surgery

## 2015-06-08 DIAGNOSIS — N3041 Irradiation cystitis with hematuria: Secondary | ICD-10-CM | POA: Diagnosis not present

## 2015-06-08 NOTE — Progress Notes (Signed)
[name], [name] ([id number]) Visit Report for 06/08/2015 HBO Details Patient Name: [name], [name] Date of Service: 06/08/2015 10:00 AM Medical Record Number: [id number] Patient Account Number: [id number] Date of Birth/Sex: 1919/11/04 (81 y.o. Male) Treating RN: Primary Care Physician: Frazier Richards Other Clinician: Jacqulyn Bath Referring Physician: Frazier Richards Treating Physician/Extender: Frann Rider in Treatment: 6 HBO Treatment Course Details Treatment Course Ordering Physician: Christin Fudge 1 Number: HBO Treatment Start Date: 05/05/2015 Total Treatments 40 Ordered: HBO Indication: Late Effect of Radiation HBO Treatment Details Treatment Number: 21 Patient Type: Outpatient Chamber Type: Monoplace Chamber #: [id number] Treatment Protocol: 2.0 ATA with 90 minutes oxygen, and no air breaks Treatment Details Compression Rate Down: 1.5 psi / minute De-Compression Rate Up: 1.5 psi / minute Air breaks and breathing Compress Tx Pressure Decompress Decompress periods Begins Reached Begins Ends (leave unused spaces blank) Chamber Pressure 1 ATA 2.0 ATA - - - - - - 2.0 ATA 1 ATA Clock Time (24 hr) 09:41 09:51 - - - - - - 11:21 11:31 Treatment Length: 110 (minutes) Treatment Segments: 4 Capillary Blood Glucose Pre Capillary Blood Glucose (mg/dl): Post Capillary Blood Glucose (mg/dl): Vital Signs Capillary Blood Glucose Reference Range: 80 - 120 mg / dl HBO Diabetic Blood Glucose Intervention Range: <131 mg/dl or >249 mg/dl Time Vitals Blood Respiratory Capillary Blood Glucose Pulse Action Type: Pulse: Temperature: Taken: Pressure: Rate: Glucose (mg/dl): Meter #: Oximetry (%) Taken: Pre 09:35 108/70 78 18 97.4 Post 11:37 122/70 54 18 97.9 Treatment Response Treatment Completion Status: Treatment Completed without Adverse Event [name], [name] ([id number]) Electronic Signature(s) Signed: 06/08/2015 4:02:36 PM By: Lorine Bears RCP,  RRT, CHT Signed: 06/08/2015 4:25:19 PM By: Christin Fudge MD, FACS Previous Signature: 06/08/2015 11:40:46 AM Version By: Christin Fudge MD, FACS Entered By: Lorine Bears on 06/08/2015 11:47:41 [name], [name] Kitchen ([id number]) -------------------------------------------------------------------------------- HBO Safety Checklist Details Patient Name: [name] Date of Service: 06/08/2015 10:00 AM Medical Record Number: [id number] Patient Account Number: [id number] Date of Birth/Sex: 1919-11-02 (81 y.o. Male) Treating RN: Primary Care Physician: Frazier Richards Other Clinician: Jacqulyn Bath Referring Physician: Frazier Richards Treating Physician/Extender: Frann Rider in Treatment: 6 HBO Safety Checklist Items Safety Checklist Consent Form Signed Patient voided / foley secured and emptied When did you last eato 07:30 am Last dose of injectable or oral agent n/a NA Ostomy pouch emptied and vented if applicable NA All implantable devices assessed, documented and approved NA Intravenous access site secured and place Valuables secured Linens and cotton and cotton/polyester blend (less than 51% polyester) Personal oil-based products / skin lotions / body lotions removed Wigs or hairpieces removed Smoking or tobacco materials removed Books / newspapers / magazines / loose paper removed Cologne, aftershave, perfume and deodorant removed Jewelry removed (may wrap wedding band) Make-up removed Hair care products removed Battery operated devices (external) removed Heating patches and chemical warmers removed NA Titanium eyewear removed NA Nail polish cured greater than 10 hours NA Casting material cured greater than 10 hours Hearing aids removed Loose dentures or partials removed NA Prosthetics have been removed Patient demonstrates correct use of air break device (if applicable) Patient concerns have been addressed Patient grounding bracelet on and cord  attached to chamber Specifics for Inpatients (complete in addition to above) Medication sheet sent with patient Intravenous medications needed or due during therapy sent with patient [name], HONSE. ([id number]) Drainage tubes (e.g. nasogastric tube or chest tube secured and vented) Endotracheal or Tracheotomy tube secured Cuff deflated of air and inflated with  saline Airway suctioned Electronic Signature(s) Signed: 06/08/2015 4:02:36 PM By: Lorine Bears RCP, RRT, CHT Entered By: Lorine Bears on 06/08/2015 09:43:25

## 2015-06-08 NOTE — Progress Notes (Signed)
SANTANNA, OLENIK ([id number]) Visit Report for 06/08/2015 Arrival Information Details Patient Name: [name], [name] Date of Service: 06/08/2015 10:00 AM Medical Record Number: [id number] Patient Account Number: [id number] Date of Birth/Sex: Jul 11, 1920 (81 y.o. Male) Treating RN: Primary Care Physician: Frazier Richards Other Clinician: Jacqulyn Bath Referring Physician: Frazier Richards Treating Physician/Extender: Frann Rider in Treatment: 6 Visit Information History Since Last Visit Added or deleted any medications: No Patient Arrived: Ambulatory Any new allergies or adverse reactions: No Arrival Time: 09:25 Had a fall or experienced change in No Accompanied By: wife activities of daily living that may affect Transfer Assistance: None risk of falls: Patient Identification Verified: Yes Signs or symptoms of abuse/neglect since last No Secondary Verification Process Yes visito Completed: Hospitalized since last visit: No Patient Has Alerts: Yes Pain Present Now: No Electronic Signature(s) Signed: 06/08/2015 4:02:36 PM By: Lorine Bears RCP, RRT, CHT Entered By: Becky Sax, Amado Nash on 06/08/2015 10:17:53 [name], [name] ([id number]) -------------------------------------------------------------------------------- Encounter Discharge Information Details Patient Name: [name] Date of Service: 06/08/2015 10:00 AM Medical Record Number: [id number] Patient Account Number: [id number] Date of Birth/Sex: 1920/05/25 (81 y.o. Male) Treating RN: Primary Care Physician: Frazier Richards Other Clinician: Jacqulyn Bath Referring Physician: Frazier Richards Treating Physician/Extender: Frann Rider in Treatment: 6 Encounter Discharge Information Items Discharge Pain Level: 0 Discharge Condition: Stable Ambulatory Status: Ambulatory Discharge Destination: Home Private Transportation: Auto Accompanied By: wife Schedule Follow-up  Appointment: No Medication Reconciliation completed and No provided to Patient/Care Ashtan Laton: Clinical Summary of Care: Notes Patient has an HBO treatment scheduled on 06/09/15 @ 10:00 am. Electronic Signature(s) Signed: 06/08/2015 4:02:36 PM By: Lorine Bears RCP, RRT, CHT Entered By: Becky Sax, Amado Nash on 06/08/2015 11:49:01 [place], [place]. ([id number]) -------------------------------------------------------------------------------- Vitals Details Patient Name: [name] Date of Service: 06/08/2015 10:00 AM Medical Record Number: [id number] Patient Account Number: [id number] Date of Birth/Sex: 1919/12/07 (81 y.o. Male) Treating RN: Primary Care Physician: Frazier Richards Other Clinician: Jacqulyn Bath Referring Physician: Frazier Richards Treating Physician/Extender: Frann Rider in Treatment: 6 Vital Signs Time Taken: 09:30 Temperature (F): 97.4 Height (in): 67 Pulse (bpm): 78 Weight (lbs): 116.8 Respiratory Rate (breaths/min): 18 Body Mass Index (BMI): 18.3 Blood Pressure (mmHg): 108/70 Reference Range: 80 - 120 mg / dl Electronic Signature(s) Signed: 06/08/2015 4:02:36 PM By: Lorine Bears RCP, RRT, CHT Entered By: Becky Sax, Amado Nash on 06/08/2015 10:18:08

## 2015-06-09 ENCOUNTER — Encounter: Payer: Medicare Other | Attending: Surgery | Admitting: Surgery

## 2015-06-09 DIAGNOSIS — D075 Carcinoma in situ of prostate: Secondary | ICD-10-CM | POA: Diagnosis not present

## 2015-06-09 DIAGNOSIS — G4730 Sleep apnea, unspecified: Secondary | ICD-10-CM | POA: Diagnosis not present

## 2015-06-09 DIAGNOSIS — N3041 Irradiation cystitis with hematuria: Secondary | ICD-10-CM | POA: Insufficient documentation

## 2015-06-09 NOTE — Progress Notes (Signed)
[name], ELLIS ([id number]) Visit Report for 06/09/2015 HBO Details Patient Name: [name], [name]. Date of Service: 06/09/2015 10:00 AM Medical Record Number: [id number] Patient Account Number: [id number] Date of Birth/Sex: Sep 12, 1919 (81 y.o. Male) Treating RN: Primary Care Physician: Frazier Richards Other Clinician: Jacqulyn Bath Referring Physician: Frazier Richards Treating Physician/Extender: Frann Rider in Treatment: 6 HBO Treatment Course Details Treatment Course Ordering Physician: Christin Fudge 1 Number: HBO Treatment Start Date: 05/05/2015 Total Treatments 40 Ordered: HBO Indication: Late Effect of Radiation HBO Treatment Details Treatment Number: 22 Patient Type: Outpatient Chamber Type: Monoplace Chamber #: HBO #[id number] Treatment Protocol: 2.0 ATA with 90 minutes oxygen, and no air breaks Treatment Details Compression Rate Down: 1.5 psi / minute De-Compression Rate Up: 1.5 psi / minute Air breaks and breathing Compress Tx Pressure Decompress Decompress periods Begins Reached Begins Ends (leave unused spaces blank) Chamber Pressure 1 ATA 2.0 ATA - - - - - - 2.0 ATA 1 ATA Clock Time (24 hr) 09:46 09:56 - - - - - - 11:27 11:37 Treatment Length: 111 (minutes) Treatment Segments: 4 Capillary Blood Glucose Pre Capillary Blood Glucose (mg/dl): Post Capillary Blood Glucose (mg/dl): Vital Signs Capillary Blood Glucose Reference Range: 80 - 120 mg / dl HBO Diabetic Blood Glucose Intervention Range: <131 mg/dl or >249 mg/dl Time Vitals Blood Respiratory Capillary Blood Glucose Pulse Action Type: Pulse: Temperature: Taken: Pressure: Rate: Glucose (mg/dl): Meter #: Oximetry (%) Taken: Pre 09:30 122/82 54 18 98 Post 11:40 120/60 60 18 97.5 Treatment Response Treatment Completion Status: Treatment Completed without Adverse Event CRISTAN, SCHERZER ([id number]) Electronic Signature(s) Signed: 06/09/2015 3:25:16 PM By: Lorine Bears RCP, RRT,  CHT Signed: 06/09/2015 4:24:37 PM By: Christin Fudge MD, FACS Previous Signature: 06/09/2015 11:54:54 AM Version By: Christin Fudge MD, FACS Entered By: Lorine Bears on 06/09/2015 13:26:46 [place], [name] ([id number]) -------------------------------------------------------------------------------- HBO Safety Checklist Details Patient Name: [name] Date of Service: 06/09/2015 10:00 AM Medical Record Number: [id number] Patient Account Number: [id number] Date of Birth/Sex: 1920-06-18 (81 y.o. Male) Treating RN: Primary Care Physician: Frazier Richards Other Clinician: Jacqulyn Bath Referring Physician: Frazier Richards Treating Physician/Extender: Frann Rider in Treatment: 6 HBO Safety Checklist Items Safety Checklist Consent Form Signed Patient voided / foley secured and emptied When did you last eato 07:30 am Last dose of injectable or oral agent n/a NA Ostomy pouch emptied and vented if applicable NA All implantable devices assessed, documented and approved NA Intravenous access site secured and place Valuables secured Linens and cotton and cotton/polyester blend (less than 51% polyester) Personal oil-based products / skin lotions / body lotions removed Wigs or hairpieces removed Smoking or tobacco materials removed Books / newspapers / magazines / loose paper removed Cologne, aftershave, perfume and deodorant removed Jewelry removed (may wrap wedding band) Make-up removed Hair care products removed Battery operated devices (external) removed Heating patches and chemical warmers removed NA Titanium eyewear removed NA Nail polish cured greater than 10 hours NA Casting material cured greater than 10 hours Hearing aids removed Loose dentures or partials removed NA Prosthetics have been removed Patient demonstrates correct use of air break device (if applicable) Patient concerns have been addressed Patient grounding bracelet on and cord attached  to chamber Specifics for Inpatients (complete in addition to above) Medication sheet sent with patient Intravenous medications needed or due during therapy sent with patient NIXON, KOLTON. ([id number]) Drainage tubes (e.g. nasogastric tube or chest tube secured and vented) Endotracheal or Tracheotomy tube secured Cuff deflated of air and inflated  with saline Airway suctioned Electronic Signature(s) Signed: 06/09/2015 3:25:16 PM By: Lorine Bears RCP, RRT, CHT Entered By: Lorine Bears on 06/09/2015 09:48:16

## 2015-06-09 NOTE — Progress Notes (Signed)
PINKNEY, VENARD ([id number]) Visit Report for 06/09/2015 Arrival Information Details Patient Name: TEX, CONROY Date of Service: 06/09/2015 10:00 AM Medical Record Number: [id number] Patient Account Number: [id number] Date of Birth/Sex: 07/19/1920 (81 y.o. Male) Treating RN: Primary Care Physician: Frazier Richards Other Clinician: Jacqulyn Bath Referring Physician: Frazier Richards Treating Physician/Extender: Frann Rider in Treatment: 6 Visit Information History Since Last Visit Added or deleted any medications: No Patient Arrived: Ambulatory Any new allergies or adverse reactions: No Arrival Time: 09:30 Had a fall or experienced change in No Accompanied By: wife activities of daily living that may affect Transfer Assistance: None risk of falls: Patient Identification Verified: Yes Signs or symptoms of abuse/neglect since last No Secondary Verification Process Yes visito Completed: Hospitalized since last visit: No Patient Has Alerts: Yes Pain Present Now: No Electronic Signature(s) Signed: 06/09/2015 3:25:16 PM By: Lorine Bears RCP, RRT, CHT Entered By: Becky Sax, Amado Nash on 06/09/2015 09:47:00 Fridman, [name] ([id number]) -------------------------------------------------------------------------------- Encounter Discharge Information Details Patient Name: [name] Date of Service: 06/09/2015 10:00 AM Medical Record Number: [id number] Patient Account Number: [id number] Date of Birth/Sex: [birth year] (81 y.o. Male) Treating RN: Primary Care Physician: Frazier Richards Other Clinician: Jacqulyn Bath Referring Physician: Frazier Richards Treating Physician/Extender: Frann Rider in Treatment: 6 Encounter Discharge Information Items Discharge Pain Level: 0 Discharge Condition: Stable Ambulatory Status: Ambulatory Discharge Destination: Home Private Transportation: Auto Accompanied By: wife Schedule Follow-up  Appointment: No Medication Reconciliation completed and No provided to Patient/Care Eleanor Gatliff: Clinical Summary of Care: Notes Patient has an HBO treatment scheduled on 06/10/15 at 10:00 am. Electronic Signature(s) Signed: 06/09/2015 3:25:16 PM By: Lorine Bears RCP, RRT, CHT Entered By: Lorine Bears on 06/09/2015 13:29:28 [name] ([id number]) -------------------------------------------------------------------------------- Vitals Details Patient Name: [name] Date of Service: 06/09/2015 10:00 AM Medical Record Number: [id number] Patient Account Number: [id number] Date of Birth/Sex: May 04, 1920 (81 y.o. Male) Treating RN: Primary Care Physician: Frazier Richards Other Clinician: Jacqulyn Bath Referring Physician: Frazier Richards Treating Physician/Extender: Frann Rider in Treatment: 6 Vital Signs Time Taken: 09:30 Temperature (F): 98.0 Height (in): 67 Pulse (bpm): 54 Weight (lbs): 116.8 Respiratory Rate (breaths/min): 18 Body Mass Index (BMI): 18.3 Blood Pressure (mmHg): 122/82 Reference Range: 80 - 120 mg / dl Electronic Signature(s) Signed: 06/09/2015 3:25:16 PM By: Lorine Bears RCP, RRT, CHT Entered By: Lorine Bears on 06/09/2015 09:47:41

## 2015-06-10 ENCOUNTER — Encounter: Payer: Medicare Other | Admitting: Surgery

## 2015-06-10 DIAGNOSIS — N3041 Irradiation cystitis with hematuria: Secondary | ICD-10-CM | POA: Diagnosis not present

## 2015-06-10 NOTE — Progress Notes (Signed)
TIMOTY, BOURKE ([id number]) Visit Report for 06/10/2015 Arrival Information Details Patient Name: ROLLINS, WRIGHTSON Date of Service: 06/10/2015 10:00 AM Medical Record Number: [id number] Patient Account Number: [id number] Date of Birth/Sex: 1919-11-01 (81 y.o. Male) Treating RN: Primary Care Physician: Frazier Richards Other Clinician: Jacqulyn Bath Referring Physician: Frazier Richards Treating Physician/Extender: BURNS III, Charlean Sanfilippo in Treatment: 6 Visit Information History Since Last Visit Added or deleted any medications: No Patient Arrived: Ambulatory Any new allergies or adverse reactions: No Arrival Time: 09:30 Had a fall or experienced change in No Accompanied By: wife activities of daily living that may affect Transfer Assistance: Manual risk of falls: Patient Identification Verified: Yes Signs or symptoms of abuse/neglect since last No Secondary Verification Process Yes visito Completed: Hospitalized since last visit: No Patient Has Alerts: Yes Pain Present Now: No Electronic Signature(s) Signed: 06/10/2015 2:13:33 PM By: Lorine Bears RCP, RRT, CHT Entered By: Lorine Bears on 06/10/2015 10:02:04 [name] ([id number]) -------------------------------------------------------------------------------- Encounter Discharge Information Details Patient Name: [name] Date of Service: 06/10/2015 10:00 AM Medical Record Number: [id number] Patient Account Number: [id number] Date of Birth/Sex: [birth year] (81 y.o. Male) Treating RN: Primary Care Physician: Frazier Richards Other Clinician: Jacqulyn Bath Referring Physician: Frazier Richards Treating Physician/Extender: BURNS III, Charlean Sanfilippo in Treatment: 6 Encounter Discharge Information Items Discharge Pain Level: 0 Discharge Condition: Stable Ambulatory Status: Ambulatory Discharge Destination: Home Private Transportation: Auto Accompanied By: wife Schedule Follow-up  Appointment: No Medication Reconciliation completed and No provided to Patient/Care Shani Fitch: Clinical Summary of Care: Notes Patient has an HBO treatment scheduled on 06/11/15 at 10:00 am. Electronic Signature(s) Signed: 06/10/2015 2:13:33 PM By: Lorine Bears RCP, RRT, CHT Entered By: Becky Sax, Amado Nash on 06/10/2015 13:23:34 Mustard, [name] ([id number]) -------------------------------------------------------------------------------- Vitals Details Patient Name: [name] Date of Service: 06/10/2015 10:00 AM Medical Record Number: [id number] Patient Account Number: [id number] Date of Birth/Sex: [birth year] (81 y.o. Male) Treating RN: Primary Care Physician: Frazier Richards Other Clinician: Jacqulyn Bath Referring Physician: Frazier Richards Treating Physician/Extender: BURNS III, Charlean Sanfilippo in Treatment: 6 Vital Signs Time Taken: 09:30 Temperature (F): 97.5 Height (in): 67 Pulse (bpm): 60 Weight (lbs): 116.8 Respiratory Rate (breaths/min): 18 Body Mass Index (BMI): 18.3 Blood Pressure (mmHg): 114/66 Reference Range: 80 - 120 mg / dl Electronic Signature(s) Signed: 06/10/2015 2:13:33 PM By: Lorine Bears RCP, RRT, CHT Entered By: Becky Sax, Amado Nash on 06/10/2015 10:02:58

## 2015-06-11 ENCOUNTER — Encounter: Payer: Medicare Other | Admitting: Surgery

## 2015-06-11 DIAGNOSIS — N3041 Irradiation cystitis with hematuria: Secondary | ICD-10-CM | POA: Diagnosis not present

## 2015-06-11 NOTE — Progress Notes (Signed)
[name] ([id number]) Visit Report for 06/10/2015 HBO Details Patient Name: [name], [name]. Date of Service: 06/10/2015 10:00 AM Medical Record Number: [id number] Patient Account Number: [id number] Date of Birth/Sex: 09/28/1919 (81 y.o. Male) Treating RN: Primary Care Physician: Frazier Richards Other Clinician: Jacqulyn Bath Referring Physician: Frazier Richards Treating Physician/Extender: BURNS III, Charlean Sanfilippo in Treatment: 6 HBO Treatment Course Details Treatment Course Ordering Physician: Christin Fudge 1 Number: HBO Treatment Start Date: 05/05/2015 Total Treatments 40 Ordered: HBO Indication: Late Effect of Radiation HBO Treatment Details Treatment Number: 23 Patient Type: Outpatient Chamber Type: Monoplace Chamber #: HBO #[id number] Treatment Protocol: 2.0 ATA with 90 minutes oxygen, and no air breaks Treatment Details Compression Rate Down: 1.5 psi / minute De-Compression Rate Up: 1.5 psi / minute Air breaks and breathing Compress Tx Pressure Decompress Decompress periods Begins Reached Begins Ends (leave unused spaces blank) Chamber Pressure 1 ATA 2.0 ATA - - - - - - 2.0 ATA 1 ATA Clock Time (24 hr) 09:57 10:07 - - - - - - 11:38 11:49 Treatment Length: 112 (minutes) Treatment Segments: 4 Capillary Blood Glucose Pre Capillary Blood Glucose (mg/dl): Post Capillary Blood Glucose (mg/dl): Vital Signs Capillary Blood Glucose Reference Range: 80 - 120 mg / dl HBO Diabetic Blood Glucose Intervention Range: <131 mg/dl or >249 mg/dl Time Vitals Blood Respiratory Capillary Blood Glucose Pulse Action Type: Pulse: Temperature: Taken: Pressure: Rate: Glucose (mg/dl): Meter #: Oximetry (%) Taken: Pre 09:30 114/66 60 18 97.5 Post 11:57 106/62 48 18 97.7 Treatment Response Treatment Completion Status: Treatment Completed without Adverse Event [name], [name] ([id number]) Electronic Signature(s) Signed: 06/10/2015 2:13:33 PM By: Lorine Bears RCP,  RRT, CHT Signed: 06/10/2015 4:23:42 PM By: Loletha Grayer MD Previous Signature: 06/10/2015 12:59:47 PM Version By: Loletha Grayer MD Entered By: Lorine Bears on 06/10/2015 13:22:37 [name], [name] ([id number]) -------------------------------------------------------------------------------- HBO Safety Checklist Details Patient Name: [name] Date of Service: 06/10/2015 10:00 AM Medical Record Number: [id number] Patient Account Number: [id number] Date of Birth/Sex: [birth year] (81 y.o. Male) Treating RN: Primary Care Physician: Frazier Richards Other Clinician: Jacqulyn Bath Referring Physician: Frazier Richards Treating Physician/Extender: BURNS III, Charlean Sanfilippo in Treatment: 6 HBO Safety Checklist Items Safety Checklist Consent Form Signed Patient voided / foley secured and emptied When did you last eato 07:00 am Last dose of injectable or oral agent n/a NA Ostomy pouch emptied and vented if applicable NA All implantable devices assessed, documented and approved NA Intravenous access site secured and place Valuables secured Linens and cotton and cotton/polyester blend (less than 51% polyester) Personal oil-based products / skin lotions / body lotions removed Wigs or hairpieces removed Smoking or tobacco materials removed Books / newspapers / magazines / loose paper removed Cologne, aftershave, perfume and deodorant removed Jewelry removed (may wrap wedding band) Make-up removed Hair care products removed Battery operated devices (external) removed Heating patches and chemical warmers removed NA Titanium eyewear removed NA Nail polish cured greater than 10 hours NA Casting material cured greater than 10 hours Hearing aids removed Loose dentures or partials removed NA Prosthetics have been removed Patient demonstrates correct use of air break device (if applicable) Patient concerns have been addressed Patient grounding bracelet on and cord  attached to chamber Specifics for Inpatients (complete in addition to above) Medication sheet sent with patient Intravenous medications needed or due during therapy sent with patient [name], [name]. ([id number]) Drainage tubes (e.g. nasogastric tube or chest tube secured and vented) Endotracheal or Tracheotomy tube secured Cuff deflated of air  and inflated with saline Airway suctioned Electronic Signature(s) Signed: 06/10/2015 2:13:33 PM By: Lorine Bears RCP, RRT, CHT Entered By: Lorine Bears on 06/10/2015 10:03:51

## 2015-06-11 NOTE — Progress Notes (Signed)
[name], [name] ([id number]) Visit Report for 06/11/2015 HBO Details Patient Name: [name], [name]. Date of Service: 06/11/2015 10:00 AM Medical Record Number: [id number] Patient Account Number: [id number] Date of Birth/Sex: Feb 24, 1920 (81 y.o. Male) Treating RN: Primary Care Physician: Frazier Richards Other Clinician: Jacqulyn Bath Referring Physician: Frazier Richards Treating Physician/Extender: Frann Rider in Treatment: 7 HBO Treatment Course Details Treatment Course Ordering Physician: Christin Fudge 1 Number: HBO Treatment Start Date: 05/05/2015 Total Treatments 40 Ordered: HBO Indication: Late Effect of Radiation HBO Treatment Details Treatment Number: 24 Patient Type: Outpatient Chamber Type: Monoplace Chamber #: WLN#[id number] Treatment Protocol: 2.0 ATA with 90 minutes oxygen, and no air breaks Treatment Details Compression Rate Down: 1.5 psi / minute De-Compression Rate Up: 1.5 psi / minute Air breaks and breathing Compress Tx Pressure Decompress Decompress periods Begins Reached Begins Ends (leave unused spaces blank) Chamber Pressure 1 ATA 2.0 ATA - - - - - - 2.0 ATA 1 ATA Clock Time (24 hr) 10:14 10:26 - - - - - - 11:56 12:06 Treatment Length: 112 (minutes) Treatment Segments: 4 Capillary Blood Glucose Pre Capillary Blood Glucose (mg/dl): Post Capillary Blood Glucose (mg/dl): Vital Signs Capillary Blood Glucose Reference Range: 80 - 120 mg / dl HBO Diabetic Blood Glucose Intervention Range: <131 mg/dl or >249 mg/dl Time Vitals Blood Respiratory Capillary Blood Glucose Pulse Action Type: Pulse: Temperature: Taken: Pressure: Rate: Glucose (mg/dl): Meter #: Oximetry (%) Taken: Pre 09:25 110/66 60 18 97.7 Post 12:11 122/70 54 18 97.5 Treatment Response Treatment Completion Status: Treatment Completed without Adverse Event BURHAN, BARHAM ([id number]) Electronic Signature(s) Signed: 06/11/2015 1:07:39 PM By: Lorine Bears RCP, RRT,  CHT Signed: 06/11/2015 3:36:42 PM By: Christin Fudge MD, FACS Previous Signature: 06/11/2015 12:13:06 PM Version By: Christin Fudge MD, FACS Entered By: Lorine Bears on 06/11/2015 12:18:52 Mcglory, [name] ([id number]) -------------------------------------------------------------------------------- HBO Safety Checklist Details Patient Name: [name] Date of Service: 06/11/2015 10:00 AM Medical Record Number: [id number] Patient Account Number: [id number] Date of Birth/Sex: September 15, 1919 (81 y.o. Male) Treating RN: Primary Care Physician: Frazier Richards Other Clinician: Jacqulyn Bath Referring Physician: Frazier Richards Treating Physician/Extender: Frann Rider in Treatment: 7 HBO Safety Checklist Items Safety Checklist Consent Form Signed Patient voided / foley secured and emptied When did you last eato 07:00 am Last dose of injectable or oral agent n/a NA Ostomy pouch emptied and vented if applicable NA All implantable devices assessed, documented and approved NA Intravenous access site secured and place Valuables secured Linens and cotton and cotton/polyester blend (less than 51% polyester) Personal oil-based products / skin lotions / body lotions removed Wigs or hairpieces removed Smoking or tobacco materials removed Books / newspapers / magazines / loose paper removed Cologne, aftershave, perfume and deodorant removed Jewelry removed (may wrap wedding band) Make-up removed Hair care products removed Battery operated devices (external) removed Heating patches and chemical warmers removed NA Titanium eyewear removed NA Nail polish cured greater than 10 hours NA Casting material cured greater than 10 hours Hearing aids removed Loose dentures or partials removed NA Prosthetics have been removed Patient demonstrates correct use of air break device (if applicable) Patient concerns have been addressed Patient grounding bracelet on and cord attached  to chamber Specifics for Inpatients (complete in addition to above) Medication sheet sent with patient Intravenous medications needed or due during therapy sent with patient OZIAS, DICENZO. ([id number]) Drainage tubes (e.g. nasogastric tube or chest tube secured and vented) Endotracheal or Tracheotomy tube secured Cuff deflated of air and inflated with  saline Airway suctioned Electronic Signature(s) Signed: 06/11/2015 1:07:39 PM By: Lorine Bears RCP, RRT, CHT Entered By: Lorine Bears on 06/11/2015 10:00:25

## 2015-06-11 NOTE — Progress Notes (Signed)
WILLETT, LEFEBER ([id number]) Visit Report for 06/11/2015 Arrival Information Details Patient Name: SAXON, BARICH Date of Service: 06/11/2015 10:00 AM Medical Record Number: [id number] Patient Account Number: [id number] Date of Birth/Sex: 10/29/1919 (81 y.o. Male) Treating RN: Primary Care Physician: Frazier Richards Other Clinician: Jacqulyn Bath Referring Physician: Frazier Richards Treating Physician/Extender: Frann Rider in Treatment: 7 Visit Information History Since Last Visit Added or deleted any medications: No Patient Arrived: Ambulatory Any new allergies or adverse reactions: No Arrival Time: 09:25 Had a fall or experienced change in No Accompanied By: self activities of daily living that may affect Transfer Assistance: Manual risk of falls: Patient Identification Verified: Yes Signs or symptoms of abuse/neglect since last No Secondary Verification Process Yes visito Completed: Hospitalized since last visit: No Patient Has Alerts: Yes Pain Present Now: No Electronic Signature(s) Signed: 06/11/2015 1:07:39 PM By: Lorine Bears RCP, RRT, CHT Entered By: Becky Sax, Amado Nash on 06/11/2015 09:58:43 Brosseau, Mikal W. ([id number]) -------------------------------------------------------------------------------- Encounter Discharge Information Details Patient Name: [name] Date of Service: 06/11/2015 10:00 AM Medical Record Number: [id number] Patient Account Number: [id number] Date of Birth/Sex: 1920-05-26 (81 y.o. Male) Treating RN: Primary Care Physician: Frazier Richards Other Clinician: Jacqulyn Bath Referring Physician: Frazier Richards Treating Physician/Extender: Frann Rider in Treatment: 7 Encounter Discharge Information Items Discharge Pain Level: 0 Discharge Condition: Stable Ambulatory Status: Ambulatory Discharge Destination: Home Private Transportation: Auto Accompanied By: self Schedule Follow-up  Appointment: No Medication Reconciliation completed and No provided to Patient/Care Jatavius Ellenwood: Clinical Summary of Care: Notes Patient has an HBO treatment scheduled on 06/12/15 at 10:00 am. Electronic Signature(s) Signed: 06/11/2015 1:07:39 PM By: Lorine Bears RCP, RRT, CHT Entered By: Lorine Bears on 06/11/2015 12:19:29 [place], [name] ([id number]) -------------------------------------------------------------------------------- Vitals Details Patient Name: [name] Date of Service: 06/11/2015 10:00 AM Medical Record Number: [id number] Patient Account Number: [id number] Date of Birth/Sex: [birth year] (81 y.o. Male) Treating RN: Primary Care Physician: Frazier Richards Other Clinician: Jacqulyn Bath Referring Physician: Frazier Richards Treating Physician/Extender: Frann Rider in Treatment: 7 Vital Signs Time Taken: 09:25 Temperature (F): 97.7 Height (in): 67 Pulse (bpm): 60 Weight (lbs): 116.8 Respiratory Rate (breaths/min): 18 Body Mass Index (BMI): 18.3 Blood Pressure (mmHg): 110/66 Reference Range: 80 - 120 mg / dl Electronic Signature(s) Signed: 06/11/2015 1:07:39 PM By: Lorine Bears RCP, RRT, CHT Entered By: Becky Sax, Amado Nash on 06/11/2015 09:59:07

## 2015-06-12 ENCOUNTER — Encounter: Payer: Medicare Other | Admitting: Surgery

## 2015-06-15 ENCOUNTER — Encounter: Payer: Medicare Other | Admitting: Surgery

## 2015-06-15 DIAGNOSIS — N3041 Irradiation cystitis with hematuria: Secondary | ICD-10-CM | POA: Diagnosis not present

## 2015-06-15 NOTE — Progress Notes (Signed)
VIRGEL, HARO ([id number]) Visit Report for 06/15/2015 Arrival Information Details Patient Name: [name], [name] Date of Service: 06/15/2015 10:00 AM Medical Record Number: [id number] Patient Account Number: [id number] Date of Birth/Sex: [birth year] (81 y.o. Male) Treating RN: Primary Care Physician: Frazier Richards Other Clinician: Jacqulyn Bath Referring Physician: Frazier Richards Treating Physician/Extender: Frann Rider in Treatment: 7 Visit Information History Since Last Visit Added or deleted any medications: No Patient Arrived: Ambulatory Any new allergies or adverse reactions: No Arrival Time: 09:35 Had a fall or experienced change in No Accompanied By: self activities of daily living that may affect Transfer Assistance: None risk of falls: Patient Identification Verified: Yes Signs or symptoms of abuse/neglect since last No Secondary Verification Process Yes visito Completed: Hospitalized since last visit: No Patient Has Alerts: Yes Pain Present Now: No Electronic Signature(s) Signed: 06/15/2015 12:19:17 PM By: Lorine Bears RCP, RRT, CHT Entered By: Becky Sax, Amado Nash on 06/15/2015 10:12:25 [name] ([id number]) -------------------------------------------------------------------------------- Encounter Discharge Information Details Patient Name: [name] Date of Service: 06/15/2015 10:00 AM Medical Record Number: [id number] Patient Account Number: [id number] Date of Birth/Sex: 1919-12-09 (81 y.o. Male) Treating RN: Primary Care Physician: Frazier Richards Other Clinician: Jacqulyn Bath Referring Physician: Frazier Richards Treating Physician/Extender: Frann Rider in Treatment: 7 Encounter Discharge Information Items Discharge Pain Level: 0 Discharge Condition: Stable Ambulatory Status: Ambulatory Discharge Destination: Home Private Transportation: Auto Accompanied By: self Schedule Follow-up  Appointment: No Medication Reconciliation completed and No provided to Patient/Care [name]: Clinical Summary of Care: Notes Patient has an HBO treatment scheduled on 06/16/15 at 10:00 am. Electronic Signature(s) Signed: 06/15/2015 12:19:17 PM By: Lorine Bears RCP, RRT, CHT Entered By: Becky Sax, Amado Nash on 06/15/2015 12:18:55 [name], [name] ([id number]) -------------------------------------------------------------------------------- Vitals Details Patient Name: [name] Date of Service: 06/15/2015 10:00 AM Medical Record Number: [id number] Patient Account Number: [id number] Date of Birth/Sex: 1920-04-03 (81 y.o. Male) Treating RN: Primary Care Physician: Frazier Richards Other Clinician: Jacqulyn Bath Referring Physician: Frazier Richards Treating Physician/Extender: Frann Rider in Treatment: 7 Vital Signs Time Taken: 09:35 Temperature (F): 96.9 Height (in): 67 Pulse (bpm): 60 Weight (lbs): 116.8 Respiratory Rate (breaths/min): 18 Body Mass Index (BMI): 18.3 Blood Pressure (mmHg): 128/74 Reference Range: 80 - 120 mg / dl Electronic Signature(s) Signed: 06/15/2015 12:19:17 PM By: Lorine Bears RCP, RRT, CHT Entered By: Becky Sax, Amado Nash on 06/15/2015 10:13:05

## 2015-06-15 NOTE — Progress Notes (Signed)
JAREMY, NOSAL ([id number]) Visit Report for 06/15/2015 HBO Details Patient Name: [name], [name]. Date of Service: 06/15/2015 10:00 AM Medical Record Number: [id number] Patient Account Number: [id number] Date of Birth/Sex: 23-Jul-1920 (81 y.o. Male) Treating RN: Primary Care Physician: Frazier Richards Other Clinician: Jacqulyn Bath Referring Physician: Frazier Richards Treating Physician/Extender: Frann Rider in Treatment: 7 HBO Treatment Course Details Treatment Course Ordering Physician: Christin Fudge 1 Number: HBO Treatment Start Date: 05/05/2015 Total Treatments 40 Ordered: HBO Indication: Late Effect of Radiation HBO Treatment Details Treatment Number: 25 Patient Type: Outpatient Chamber Type: Monoplace Chamber #: HBO #[id number] Treatment Protocol: 2.0 ATA with 90 minutes oxygen, and no air breaks Treatment Details Compression Rate Down: 1.5 psi / minute De-Compression Rate Up: 1.5 psi / minute Air breaks and breathing Compress Tx Pressure Decompress Decompress periods Begins Reached Begins Ends (leave unused spaces blank) Chamber Pressure 1 ATA 2.0 ATA - - - - - - 2.0 ATA 1 ATA Clock Time (24 hr) 09:59 10:09 - - - - - - 11:39 11:49 Treatment Length: 110 (minutes) Treatment Segments: 4 Capillary Blood Glucose Pre Capillary Blood Glucose (mg/dl): Post Capillary Blood Glucose (mg/dl): Vital Signs Capillary Blood Glucose Reference Range: 80 - 120 mg / dl HBO Diabetic Blood Glucose Intervention Range: <131 mg/dl or >249 mg/dl Time Vitals Blood Respiratory Capillary Blood Glucose Pulse Action Type: Pulse: Temperature: Taken: Pressure: Rate: Glucose (mg/dl): Meter #: Oximetry (%) Taken: Pre 09:35 128/74 60 18 96.9 Post 11:55 112/70 48 18 98 Treatment Response Treatment Completion Status: Treatment Completed without Adverse Event ZACCHARY, POMPEI ([id number]) Electronic Signature(s) Signed: 06/15/2015 12:19:17 PM By: Lorine Bears RCP, RRT,  CHT Signed: 06/15/2015 4:02:36 PM By: Christin Fudge MD, FACS Entered By: Lorine Bears on 06/15/2015 12:18:04 [name] ([id number]) -------------------------------------------------------------------------------- HBO Safety Checklist Details Patient Name: [name] Date of Service: 06/15/2015 10:00 AM Medical Record Number: [id number] Patient Account Number: [id number] Date of Birth/Sex: [birth year] (81 y.o. Male) Treating RN: Primary Care Physician: Frazier Richards Other Clinician: Jacqulyn Bath Referring Physician: Frazier Richards Treating Physician/Extender: Frann Rider in Treatment: 7 HBO Safety Checklist Items Safety Checklist Consent Form Signed Patient voided / foley secured and emptied When did you last eato 07:00 am Last dose of injectable or oral agent n/a NA Ostomy pouch emptied and vented if applicable NA All implantable devices assessed, documented and approved NA Intravenous access site secured and place Valuables secured Linens and cotton and cotton/polyester blend (less than 51% polyester) Personal oil-based products / skin lotions / body lotions removed Wigs or hairpieces removed Smoking or tobacco materials removed Books / newspapers / magazines / loose paper removed Cologne, aftershave, perfume and deodorant removed Jewelry removed (may wrap wedding band) Make-up removed Hair care products removed Battery operated devices (external) removed Heating patches and chemical warmers removed NA Titanium eyewear removed NA Nail polish cured greater than 10 hours NA Casting material cured greater than 10 hours Hearing aids removed Loose dentures or partials removed NA Prosthetics have been removed Patient demonstrates correct use of air break device (if applicable) Patient concerns have been addressed Patient grounding bracelet on and cord attached to chamber Specifics for Inpatients (complete in addition to  above) Medication sheet sent with patient Intravenous medications needed or due during therapy sent with patient NAVEED, HUMPHRES. ([id number]) Drainage tubes (e.g. nasogastric tube or chest tube secured and vented) Endotracheal or Tracheotomy tube secured Cuff deflated of air and inflated with saline Airway suctioned Electronic Signature(s) Signed: 06/15/2015 12:19:17 PM By:  Becky Sax, Sallie RCP, RRT, CHT Entered By: Lorine Bears on 06/15/2015 10:13:37

## 2015-06-16 ENCOUNTER — Encounter: Payer: Medicare Other | Admitting: Surgery

## 2015-06-16 DIAGNOSIS — N3041 Irradiation cystitis with hematuria: Secondary | ICD-10-CM | POA: Diagnosis not present

## 2015-06-17 ENCOUNTER — Encounter: Payer: Medicare Other | Admitting: Surgery

## 2015-06-17 DIAGNOSIS — N3041 Irradiation cystitis with hematuria: Secondary | ICD-10-CM | POA: Diagnosis not present

## 2015-06-17 NOTE — Progress Notes (Signed)
AIDRIC, ENDICOTT ([id number]) Visit Report for 06/17/2015 Arrival Information Details Patient Name: RONALD, LONDO Date of Service: 06/17/2015 10:00 AM Medical Record Number: [id number] Patient Account Number: [id number] Date of Birth/Sex: 05/11/1920 (81 y.o. Male) Treating RN: Primary Care Physician: Frazier Richards Other Clinician: Jacqulyn Bath Referring Physician: Frazier Richards Treating Physician/Extender: BURNS III, Charlean Sanfilippo in Treatment: 7 Visit Information History Since Last Visit Added or deleted any medications: No Patient Arrived: Ambulatory Any new allergies or adverse reactions: No Arrival Time: 10:42 Had a fall or experienced change in No Accompanied By: wife activities of daily living that may affect Transfer Assistance: Manual risk of falls: Patient Identification Verified: Yes Signs or symptoms of abuse/neglect since last No Secondary Verification Process Yes visito Completed: Pain Present Now: No Patient Has Alerts: Yes Electronic Signature(s) Signed: 06/17/2015 3:18:44 PM By: Lorine Bears RCP, RRT, CHT Entered By: Becky Sax, Amado Nash on 06/17/2015 10:01:39 Dearinger, [name] ([id number]) -------------------------------------------------------------------------------- Encounter Discharge Information Details Patient Name: [name] Date of Service: 06/17/2015 10:00 AM Medical Record Number: [id number] Patient Account Number: [id number] Date of Birth/Sex: [birth year] (81 y.o. Male) Treating RN: Primary Care Physician: Frazier Richards Other Clinician: Jacqulyn Bath Referring Physician: Frazier Richards Treating Physician/Extender: BURNS III, Charlean Sanfilippo in Treatment: 7 Encounter Discharge Information Items Discharge Pain Level: 0 Discharge Condition: Stable Ambulatory Status: Ambulatory Discharge Destination: Home Private Transportation: Auto Accompanied By: wife Schedule Follow-up Appointment: No Medication  Reconciliation completed and No provided to Patient/Care Sophronia Varney: Clinical Summary of Care: Notes Patient has an HBO treatment scheduled on 06/18/15 at 10:00 am. Electronic Signature(s) Signed: 06/17/2015 3:18:44 PM By: Lorine Bears RCP, RRT, CHT Entered By: Becky Sax, Amado Nash on 06/17/2015 15:01:15 [place], [name] ([id number]) -------------------------------------------------------------------------------- Vitals Details Patient Name: [name] Date of Service: 06/17/2015 10:00 AM Medical Record Number: [id number] Patient Account Number: [id number] Date of Birth/Sex: 02-06-1920 (81 y.o. Male) Treating RN: Primary Care Physician: Frazier Richards Other Clinician: Jacqulyn Bath Referring Physician: Frazier Richards Treating Physician/Extender: BURNS III, Charlean Sanfilippo in Treatment: 7 Vital Signs Time Taken: 09:42 Temperature (F): 97.1 Height (in): 67 Pulse (bpm): 66 Weight (lbs): 116.8 Respiratory Rate (breaths/min): 18 Body Mass Index (BMI): 18.3 Blood Pressure (mmHg): 122/70 Reference Range: 80 - 120 mg / dl Electronic Signature(s) Signed: 06/17/2015 3:18:44 PM By: Lorine Bears RCP, RRT, CHT Entered By: Becky Sax, Amado Nash on 06/17/2015 10:02:01

## 2015-06-17 NOTE — Progress Notes (Signed)
[name], [name] ([id number]) Visit Report for 06/16/2015 HBO Details Patient Name: [name], [name]. Date of Service: 06/16/2015 10:00 AM Medical Record Number: [id number] Patient Account Number: [id number] Date of Birth/Sex: 04-14-1920 (81 y.o. Male) Treating RN: Primary Care Physician: Frazier Richards Other Clinician: Jacqulyn Bath Referring Physician: Frazier Richards Treating Physician/Extender: Frann Rider in Treatment: 7 HBO Treatment Course Details Treatment Course Ordering Physician: Christin Fudge 1 Number: HBO Treatment Start Date: 05/05/2015 Total Treatments 40 Ordered: HBO Indication: Late Effect of Radiation HBO Treatment Details Treatment Number: 26 Patient Type: Outpatient Chamber Type: Monoplace Chamber #: [id number] Treatment Protocol: 2.0 ATA with 90 minutes oxygen, and no air breaks Treatment Details Compression Rate Down: 1.5 psi / minute De-Compression Rate Up: 1.5 psi / minute Air breaks and breathing Compress Tx Pressure Decompress Decompress periods Begins Reached Begins Ends (leave unused spaces blank) Chamber Pressure 1 ATA 2.0 ATA - - - - - - 2.0 ATA 1 ATA Clock Time (24 hr) 10:11 10:23 - - - - - - 11:53 12:03 Treatment Length: 112 (minutes) Treatment Segments: 4 Capillary Blood Glucose Pre Capillary Blood Glucose (mg/dl): Post Capillary Blood Glucose (mg/dl): Vital Signs Capillary Blood Glucose Reference Range: 80 - 120 mg / dl HBO Diabetic Blood Glucose Intervention Range: <131 mg/dl or >249 mg/dl Time Vitals Blood Respiratory Capillary Blood Glucose Pulse Action Type: Pulse: Temperature: Taken: Pressure: Rate: Glucose (mg/dl): Meter #: Oximetry (%) Taken: Pre 09:40 112/60 78 18 98.1 Post 12:07 104/64 42 18 97.8 Treatment Response Treatment Completion Status: Treatment Completed without Adverse Event Stucki, Toy W. ([id number]) HBO Attestation I certify that I supervised this HBO treatment in accordance with Medicare  guidelines. A trained Yes emergency response team is readily available per hospital policies and procedures. Continue HBOT as ordered. Yes Electronic Signature(s) Signed: 06/16/2015 4:34:10 PM By: Christin Fudge MD, FACS Signed: 06/16/2015 4:46:03 PM By: Lorine Bears RCP, RRT, CHT Previous Signature: 06/16/2015 12:04:18 PM Version By: Christin Fudge MD, FACS Entered By: Lorine Bears on 06/16/2015 12:15:56 Christians[place], [name] ([id number]) -------------------------------------------------------------------------------- HBO Safety Checklist Details Patient Name: [name] Date of Service: 06/16/2015 10:00 AM Medical Record Number: [id number] Patient Account Number: [id number] Date of Birth/Sex: Jul 21, 1920 (81 y.o. Male) Treating RN: Primary Care Physician: Frazier Richards Other Clinician: Jacqulyn Bath Referring Physician: Frazier Richards Treating Physician/Extender: Frann Rider in Treatment: 7 HBO Safety Checklist Items Safety Checklist Consent Form Signed Patient voided / foley secured and emptied When did you last eato 07:00 am Last dose of injectable or oral agent n/a NA Ostomy pouch emptied and vented if applicable NA All implantable devices assessed, documented and approved NA Intravenous access site secured and place Valuables secured Linens and cotton and cotton/polyester blend (less than 51% polyester) Personal oil-based products / skin lotions / body lotions removed Wigs or hairpieces removed Smoking or tobacco materials removed Books / newspapers / magazines / loose paper removed Cologne, aftershave, perfume and deodorant removed Jewelry removed (may wrap wedding band) Make-up removed Hair care products removed Battery operated devices (external) removed Heating patches and chemical warmers removed NA Titanium eyewear removed NA Nail polish cured greater than 10 hours NA Casting material cured greater than 10 hours Hearing  aids removed Loose dentures or partials removed NA Prosthetics have been removed Patient demonstrates correct use of air break device (if applicable) Patient concerns have been addressed Patient grounding bracelet on and cord attached to chamber Specifics for Inpatients (complete in addition to above) Medication sheet sent with patient Intravenous medications needed  or due during therapy sent with patient RAAD, CLAYSON. ([id number]) Drainage tubes (e.g. nasogastric tube or chest tube secured and vented) Endotracheal or Tracheotomy tube secured Cuff deflated of air and inflated with saline Airway suctioned Electronic Signature(s) Signed: 06/16/2015 4:46:03 PM By: Lorine Bears RCP, RRT, CHT Entered By: Lorine Bears on 06/16/2015 09:49:57

## 2015-06-17 NOTE — Progress Notes (Signed)
MATHIEU, SCHLOEMER ([id number]) Visit Report for 06/16/2015 Arrival Information Details Patient Name: [name], [name] Date of Service: 06/16/2015 10:00 AM Medical Record Number: [id number] Patient Account Number: [id number] Date of Birth/Sex: [birth year] (81 y.o. Male) Treating RN: Primary Care Physician: Frazier Richards Other Clinician: Jacqulyn Bath Referring Physician: Frazier Richards Treating Physician/Extender: Frann Rider in Treatment: 7 Visit Information History Since Last Visit Added or deleted any medications: No Patient Arrived: Ambulatory Any new allergies or adverse reactions: No Arrival Time: 09:37 Had a fall or experienced change in No Accompanied By: wife activities of daily living that may affect Transfer Assistance: Manual risk of falls: Patient Identification Verified: Yes Signs or symptoms of abuse/neglect since last No Secondary Verification Process Yes visito Completed: Hospitalized since last visit: No Patient Has Alerts: Yes Pain Present Now: No Electronic Signature(s) Signed: 06/16/2015 4:46:03 PM By: Lorine Bears RCP, RRT, CHT Entered By: Becky Sax, Amado Nash on 06/16/2015 09:48:41 Sublimity, Jaccob Viona Gilmore ([id number]) -------------------------------------------------------------------------------- Encounter Discharge Information Details Patient Name: [name] Date of Service: 06/16/2015 10:00 AM Medical Record Number: [id number] Patient Account Number: [id number] Date of Birth/Sex: 1920-03-24 (81 y.o. Male) Treating RN: Primary Care Physician: Frazier Richards Other Clinician: Jacqulyn Bath Referring Physician: Frazier Richards Treating Physician/Extender: Frann Rider in Treatment: 7 Encounter Discharge Information Items Discharge Pain Level: 0 Discharge Condition: Stable Ambulatory Status: Ambulatory Discharge Destination: Home Private Transportation: Auto Accompanied By: wife Schedule Follow-up  Appointment: No Medication Reconciliation completed and No provided to Patient/Care Finlee Concepcion: Clinical Summary of Care: Notes Patient has an HBO treatment scheduled on 06/17/15 at 10:00 am. Electronic Signature(s) Signed: 06/16/2015 4:46:03 PM By: Lorine Bears RCP, RRT, CHT Entered By: Becky Sax, Amado Nash on 06/16/2015 12:16:35 [place], [name] ([id number]) -------------------------------------------------------------------------------- Vitals Details Patient Name: [name] Date of Service: 06/16/2015 10:00 AM Medical Record Number: [id number] Patient Account Number: [id number] Date of Birth/Sex: Dec 28, 1919 (81 y.o. Male) Treating RN: Primary Care Physician: Frazier Richards Other Clinician: Jacqulyn Bath Referring Physician: Frazier Richards Treating Physician/Extender: Frann Rider in Treatment: 7 Vital Signs Time Taken: 09:40 Temperature (F): 98.1 Height (in): 67 Pulse (bpm): 78 Weight (lbs): 116.8 Respiratory Rate (breaths/min): 18 Body Mass Index (BMI): 18.3 Blood Pressure (mmHg): 112/60 Reference Range: 80 - 120 mg / dl Electronic Signature(s) Signed: 06/16/2015 4:46:03 PM By: Lorine Bears RCP, RRT, CHT Entered By: Becky Sax, Amado Nash on 06/16/2015 09:49:18

## 2015-06-18 ENCOUNTER — Encounter: Payer: Medicare Other | Admitting: Surgery

## 2015-06-18 DIAGNOSIS — N3041 Irradiation cystitis with hematuria: Secondary | ICD-10-CM | POA: Diagnosis not present

## 2015-06-18 NOTE — Progress Notes (Signed)
[name] ([id number]) Visit Report for 06/17/2015 HBO Details Patient Name: [name], [name]. Date of Service: 06/17/2015 10:00 AM Medical Record Number: [id number] Patient Account Number: [id number] Date of Birth/Sex: [birth year] (81 y.o. Male) Treating RN: Primary Care Physician: Frazier Richards Other Clinician: Jacqulyn Bath Referring Physician: Frazier Richards Treating Physician/Extender: BURNS III, Charlean Sanfilippo in Treatment: 7 HBO Treatment Course Details Treatment Course Ordering Physician: Christin Fudge 1 Number: HBO Treatment Start Date: 05/05/2015 Total Treatments 40 Ordered: HBO Indication: Late Effect of Radiation HBO Treatment Details Treatment Number: 27 Patient Type: Outpatient Chamber Type: Monoplace Chamber #: HBO [id number] Treatment Protocol: 2.0 ATA with 90 minutes oxygen, and no air breaks Treatment Details Compression Rate Down: 1.5 psi / minute De-Compression Rate Up: 1.5 psi / minute Air breaks and breathing Compress Tx Pressure Decompress Decompress periods Begins Reached Begins Ends (leave unused spaces blank) Chamber Pressure 1 ATA 2.0 ATA - - - - - - 2.0 ATA 1 ATA Clock Time (24 hr) 10:18 10:30 - - - - - - 12:00 12:10 Treatment Length: 112 (minutes) Treatment Segments: 4 Capillary Blood Glucose Pre Capillary Blood Glucose (mg/dl): Post Capillary Blood Glucose (mg/dl): Vital Signs Capillary Blood Glucose Reference Range: 80 - 120 mg / dl HBO Diabetic Blood Glucose Intervention Range: <131 mg/dl or >249 mg/dl Time Vitals Blood Respiratory Capillary Blood Glucose Pulse Action Type: Pulse: Temperature: Taken: Pressure: Rate: Glucose (mg/dl): Meter #: Oximetry (%) Taken: Pre 09:42 122/70 66 18 97.1 Post 12:13 120/70 54 18 98.4 Treatment Response Treatment Completion Status: Treatment Completed without Adverse Event Handrich, Jafar W. ([id number]) HBO Attestation I certify that I supervised this HBO treatment in accordance with Medicare  guidelines. A trained Yes emergency response team is readily available per hospital policies and procedures. Continue HBOT as ordered. Yes Electronic Signature(s) Signed: 06/17/2015 3:18:44 PM By: Lorine Bears RCP, RRT, CHT Signed: 06/17/2015 4:26:05 PM By: Loletha Grayer MD Entered By: Lorine Bears on 06/17/2015 15:00:35 Bal[place], [name] ([id number]) -------------------------------------------------------------------------------- HBO Safety Checklist Details Patient Name: [name] Date of Service: 06/17/2015 10:00 AM Medical Record Number: [id number] Patient Account Number: [id number] Date of Birth/Sex: 10/17/1919 (81 y.o. Male) Treating RN: Primary Care Physician: Frazier Richards Other Clinician: Jacqulyn Bath Referring Physician: Frazier Richards Treating Physician/Extender: BURNS III, Charlean Sanfilippo in Treatment: 7 HBO Safety Checklist Items Safety Checklist Consent Form Signed Patient voided / foley secured and emptied When did you last eato 07:00 am Last dose of injectable or oral agent n/a NA Ostomy pouch emptied and vented if applicable NA All implantable devices assessed, documented and approved NA Intravenous access site secured and place Valuables secured Linens and cotton and cotton/polyester blend (less than 51% polyester) Personal oil-based products / skin lotions / body lotions removed Wigs or hairpieces removed Smoking or tobacco materials removed Books / newspapers / magazines / loose paper removed Cologne, aftershave, perfume and deodorant removed Jewelry removed (may wrap wedding band) Make-up removed Hair care products removed Battery operated devices (external) removed Heating patches and chemical warmers removed NA Titanium eyewear removed NA Nail polish cured greater than 10 hours NA Casting material cured greater than 10 hours Hearing aids removed Loose dentures or partials removed NA Prosthetics have been  removed Patient demonstrates correct use of air break device (if applicable) Patient concerns have been addressed Patient grounding bracelet on and cord attached to chamber Specifics for Inpatients (complete in addition to above) Medication sheet sent with patient Intravenous medications needed or due during therapy sent with patient Spurgin,  Tallis W. ([id number]) Drainage tubes (e.g. nasogastric tube or chest tube secured and vented) Endotracheal or Tracheotomy tube secured Cuff deflated of air and inflated with saline Airway suctioned Electronic Signature(s) Signed: 06/17/2015 3:18:44 PM By: Lorine Bears RCP, RRT, CHT Entered By: Becky Sax, Amado Nash on 06/17/2015 10:02:38

## 2015-06-18 NOTE — Progress Notes (Signed)
[name], [name] ([id number]) Visit Report for 06/18/2015 Arrival Information Details Patient Name: [name], [name] Date of Service: 06/18/2015 10:00 AM Medical Record Number: [id number] Patient Account Number: [id number] Date of Birth/Sex: Apr 14, 1920 (81 y.o. Male) Treating RN: Primary Care Physician: Frazier Richards Other Clinician: Jacqulyn Bath Referring Physician: Frazier Richards Treating Physician/Extender: Frann Rider in Treatment: 8 Visit Information History Since Last Visit Added or deleted any medications: No Patient Arrived: Ambulatory Any new allergies or adverse reactions: No Arrival Time: 09:30 Had a fall or experienced change in No Accompanied By: wife activities of daily living that may affect Transfer Assistance: Manual risk of falls: Patient Identification Verified: Yes Signs or symptoms of abuse/neglect since last No Secondary Verification Process Yes visito Completed: Hospitalized since last visit: No Patient Has Alerts: Yes Pain Present Now: No Electronic Signature(s) Signed: 06/18/2015 1:10:42 PM By: Lorine Bears RCP, RRT, CHT Entered By: Becky Sax, Amado Nash on 06/18/2015 09:47:48 [place], Bobbi W. ([id number]) -------------------------------------------------------------------------------- Encounter Discharge Information Details Patient Name: [name] Date of Service: 06/18/2015 10:00 AM Medical Record Number: [id number] Patient Account Number: [id number] Date of Birth/Sex: [birth year] (81 y.o. Male) Treating RN: Primary Care Physician: Frazier Richards Other Clinician: Jacqulyn Bath Referring Physician: Frazier Richards Treating Physician/Extender: Frann Rider in Treatment: 8 Encounter Discharge Information Items Discharge Pain Level: 0 Discharge Condition: Stable Ambulatory Status: Ambulatory Discharge Destination: Home Private Transportation: Auto Accompanied By: wife Schedule Follow-up  Appointment: No Medication Reconciliation completed and No provided to Patient/Care Cecily Lawhorne: Clinical Summary of Care: Notes Patient has an HBO treatment scheduled on 06/19/15 at 10:00 am. Electronic Signature(s) Signed: 06/18/2015 1:10:42 PM By: Lorine Bears RCP, RRT, CHT Entered By: Lorine Bears on 06/18/2015 12:03:50 [place], [name] ([id number]) -------------------------------------------------------------------------------- Vitals Details Patient Name: [name] Date of Service: 06/18/2015 10:00 AM Medical Record Number: [id number] Patient Account Number: [id number] Date of Birth/Sex: 24-Apr-1920 (81 y.o. Male) Treating RN: Primary Care Physician: Frazier Richards Other Clinician: Jacqulyn Bath Referring Physician: Frazier Richards Treating Physician/Extender: Frann Rider in Treatment: 8 Vital Signs Time Taken: 09:30 Temperature (F): 97.7 Height (in): 67 Pulse (bpm): 54 Weight (lbs): 116.8 Respiratory Rate (breaths/min): 18 Body Mass Index (BMI): 18.3 Blood Pressure (mmHg): 122/70 Reference Range: 80 - 120 mg / dl Electronic Signature(s) Signed: 06/18/2015 1:10:42 PM By: Lorine Bears RCP, RRT, CHT Entered By: Lorine Bears on 06/18/2015 09:48:20

## 2015-06-19 ENCOUNTER — Encounter: Payer: Medicare Other | Admitting: Surgery

## 2015-06-19 DIAGNOSIS — N3041 Irradiation cystitis with hematuria: Secondary | ICD-10-CM | POA: Diagnosis not present

## 2015-06-19 NOTE — Progress Notes (Signed)
VIKRAM, STAUFFACHER ([id number]) Visit Report for 06/18/2015 HBO Details Patient Name: [name], [name] Date of Service: 06/18/2015 10:00 AM Medical Record Number: [id number] Patient Account Number: [id number] Date of Birth/Sex: 1920/02/16 (81 y.o. Male) Treating RN: Primary Care Physician: Frazier Richards Other Clinician: Jacqulyn Bath Referring Physician: Frazier Richards Treating Physician/Extender: Frann Rider in Treatment: 8 HBO Treatment Course Details Treatment Course Ordering Physician: Christin Fudge 1 Number: HBO Treatment Start Date: 05/05/2015 Total Treatments 40 Ordered: HBO Indication: Late Effect of Radiation HBO Treatment Details Treatment Number: 28 Patient Type: Outpatient Chamber Type: Monoplace Chamber #: HBO [id number] Treatment Protocol: 2.0 ATA with 90 minutes oxygen, and no air breaks Treatment Details Compression Rate Down: 1.5 psi / minute De-Compression Rate Up: 1.5 psi / minute Air breaks and breathing Compress Tx Pressure Decompress Decompress periods Begins Reached Begins Ends (leave unused spaces blank) Chamber Pressure 1 ATA 2.0 ATA - - - - - - 2.0 ATA 1 ATA Clock Time (24 hr) 09:57 10:07 - - - - - - 11:37 11:47 Treatment Length: 110 (minutes) Treatment Segments: 4 Capillary Blood Glucose Pre Capillary Blood Glucose (mg/dl): Post Capillary Blood Glucose (mg/dl): Vital Signs Capillary Blood Glucose Reference Range: 80 - 120 mg / dl HBO Diabetic Blood Glucose Intervention Range: <131 mg/dl or >249 mg/dl Time Vitals Blood Respiratory Capillary Blood Glucose Pulse Action Type: Pulse: Temperature: Taken: Pressure: Rate: Glucose (mg/dl): Meter #: Oximetry (%) Taken: Pre 09:30 122/70 54 18 97.7 Post 11:51 120/70 54 18 98.1 Treatment Response Treatment Completion Status: Treatment Completed without Adverse Event Estelle, Lorris W. ([id number]) HBO Attestation I certify that I supervised this HBO treatment in accordance with Medicare  guidelines. A trained Yes emergency response team is readily available per hospital policies and procedures. Continue HBOT as ordered. Yes Electronic Signature(s) Signed: 06/18/2015 2:35:48 PM By: Christin Fudge MD, FACS Previous Signature: 06/18/2015 1:10:42 PM Version By: Lorine Bears RCP, RRT, CHT Entered By: Christin Fudge on 06/18/2015 14:35:48 Grivas, Pleas W. ([id number]) -------------------------------------------------------------------------------- HBO Safety Checklist Details Patient Name: [name] Date of Service: 06/18/2015 10:00 AM Medical Record Number: [id number] Patient Account Number: [id number] Date of Birth/Sex: 29-Jul-1920 (81 y.o. Male) Treating RN: Primary Care Physician: Frazier Richards Other Clinician: Jacqulyn Bath Referring Physician: Frazier Richards Treating Physician/Extender: Frann Rider in Treatment: 8 HBO Safety Checklist Items Safety Checklist Consent Form Signed Patient voided / foley secured and emptied When did you last eato 07:00 am Last dose of injectable or oral agent n/a NA Ostomy pouch emptied and vented if applicable NA All implantable devices assessed, documented and approved NA Intravenous access site secured and place Valuables secured Linens and cotton and cotton/polyester blend (less than 51% polyester) Personal oil-based products / skin lotions / body lotions removed Wigs or hairpieces removed Smoking or tobacco materials removed Books / newspapers / magazines / loose paper removed Cologne, aftershave, perfume and deodorant removed Jewelry removed (may wrap wedding band) Make-up removed Hair care products removed Battery operated devices (external) removed Heating patches and chemical warmers removed NA Titanium eyewear removed NA Nail polish cured greater than 10 hours NA Casting material cured greater than 10 hours Hearing aids removed Loose dentures or partials removed NA Prosthetics have  been removed Patient demonstrates correct use of air break device (if applicable) Patient concerns have been addressed Patient grounding bracelet on and cord attached to chamber Specifics for Inpatients (complete in addition to above) Medication sheet sent with patient Intravenous medications needed or due during therapy sent with patient Hang, Izel  W. ([id number]) Drainage tubes (e.g. nasogastric tube or chest tube secured and vented) Endotracheal or Tracheotomy tube secured Cuff deflated of air and inflated with saline Airway suctioned Electronic Signature(s) Signed: 06/18/2015 1:10:42 PM By: Lorine Bears RCP, RRT, CHT Entered By: Lorine Bears on 06/18/2015 09:49:07

## 2015-06-20 NOTE — Progress Notes (Signed)
RIC, CRAFTS ([id number]) Visit Report for 06/19/2015 Arrival Information Details Patient Name: [name], [name] Date of Service: 06/19/2015 10:00 AM Medical Record Number: [id number] Patient Account Number: [id number] Date of Birth/Sex: August 17, 1919 (81 y.o. Male) Treating RN: Primary Care Physician: Frazier Richards Other Clinician: Jacqulyn Bath Referring Physician: Frazier Richards Treating Physician/Extender: Frann Rider in Treatment: 8 Visit Information History Since Last Visit Added or deleted any medications: No Patient Arrived: Ambulatory Any new allergies or adverse reactions: No Arrival Time: 09:35 Had a fall or experienced change in No Accompanied By: wife activities of daily living that may affect Transfer Assistance: Manual risk of falls: Patient Identification Verified: Yes Signs or symptoms of abuse/neglect since last No Secondary Verification Process Yes visito Completed: Hospitalized since last visit: No Patient Has Alerts: Yes Pain Present Now: No Electronic Signature(s) Signed: 06/19/2015 2:30:55 PM By: Lorine Bears RCP, RRT, CHT Entered By: Becky Sax, Amado Nash on 06/19/2015 09:38:16 Thew, [name] ([id number]) -------------------------------------------------------------------------------- Encounter Discharge Information Details Patient Name: [name] Date of Service: 06/19/2015 10:00 AM Medical Record Number: [id number] Patient Account Number: [id number] Date of Birth/Sex: 10/25/1919 (81 y.o. Male) Treating RN: Primary Care Physician: Frazier Richards Other Clinician: Jacqulyn Bath Referring Physician: Frazier Richards Treating Physician/Extender: Frann Rider in Treatment: 8 Encounter Discharge Information Items Discharge Pain Level: 0 Discharge Condition: Stable Ambulatory Status: Ambulatory Discharge Destination: Home Private Transportation: Auto Accompanied By: wife Schedule Follow-up  Appointment: No Medication Reconciliation completed and No provided to Patient/Care [name]: Clinical Summary of Care: Notes Patient has an HBO treatment scheduled on 06/22/15 at 10:00 am. Electronic Signature(s) Signed: 06/19/2015 2:30:55 PM By: Lorine Bears RCP, RRT, CHT Entered By: Lorine Bears on 06/19/2015 12:01:24 [name] ([id number]) -------------------------------------------------------------------------------- Vitals Details Patient Name: [name] Date of Service: 06/19/2015 10:00 AM Medical Record Number: [id number] Patient Account Number: [id number] Date of Birth/Sex: 1919/08/26 (81 y.o. Male) Treating RN: Primary Care Physician: Frazier Richards Other Clinician: Jacqulyn Bath Referring Physician: Frazier Richards Treating Physician/Extender: Frann Rider in Treatment: 8 Vital Signs Time Taken: 09:35 Temperature (F): 98.3 Height (in): 67 Pulse (bpm): 72 Weight (lbs): 116.8 Respiratory Rate (breaths/min): 18 Body Mass Index (BMI): 18.3 Blood Pressure (mmHg): 110/70 Reference Range: 80 - 120 mg / dl Electronic Signature(s) Signed: 06/19/2015 2:30:55 PM By: Lorine Bears RCP, RRT, CHT Entered By: Lorine Bears on 06/19/2015 09:38:43

## 2015-06-20 NOTE — Progress Notes (Signed)
[name], [name] ([id number]) Visit Report for 06/19/2015 HBO Details Patient Name: [name], [name]. Date of Service: 06/19/2015 10:00 AM Medical Record Number: [id number] Patient Account Number: [id number] Date of Birth/Sex: 1920-03-08 (81 y.o. Male) Treating RN: Primary Care Physician: Frazier Richards Other Clinician: Jacqulyn Bath Referring Physician: Frazier Richards Treating Physician/Extender: Frann Rider in Treatment: 8 HBO Treatment Course Details Treatment Course Ordering Physician: Christin Fudge 1 Number: HBO Treatment Start Date: 05/05/2015 Total Treatments 40 Ordered: HBO Indication: Late Effect of Radiation HBO Treatment Details Treatment Number: 29 Patient Type: Outpatient Chamber Type: Monoplace Chamber #: HBO [id number] Treatment Protocol: 2.0 ATA with 90 minutes oxygen, and no air breaks Treatment Details Compression Rate Down: 1.5 psi / minute De-Compression Rate Up: 1.5 psi / minute Air breaks and breathing Compress Tx Pressure Decompress Decompress periods Begins Reached Begins Ends (leave unused spaces blank) Chamber Pressure 1 ATA 2.0 ATA - - - - - - 2.0 ATA 1 ATA Clock Time (24 hr) 09:53 10:03 - - - - - - 11:34 11:44 Treatment Length: 111 (minutes) Treatment Segments: 4 Capillary Blood Glucose Pre Capillary Blood Glucose (mg/dl): Post Capillary Blood Glucose (mg/dl): Vital Signs Capillary Blood Glucose Reference Range: 80 - 120 mg / dl HBO Diabetic Blood Glucose Intervention Range: <131 mg/dl or >249 mg/dl Time Vitals Blood Respiratory Capillary Blood Glucose Pulse Action Type: Pulse: Temperature: Taken: Pressure: Rate: Glucose (mg/dl): Meter #: Oximetry (%) Taken: Pre 09:35 110/70 72 18 98.3 Post 11:47 120/68 48 18 98.3 Treatment Response Treatment Completion Status: Treatment Completed without Adverse Event [name], [name] ([id number]) Electronic Signature(s) Signed: 06/19/2015 2:30:55 PM By: Lorine Bears RCP,  RRT, CHT Signed: 06/19/2015 4:13:02 PM By: Christin Fudge MD, FACS Previous Signature: 06/19/2015 11:50:48 AM Version By: Christin Fudge MD, FACS Entered By: Lorine Bears on 06/19/2015 12:00:09 [name], [name] ([id number]) -------------------------------------------------------------------------------- HBO Safety Checklist Details Patient Name: [name] Date of Service: 06/19/2015 10:00 AM Medical Record Number: [id number] Patient Account Number: [id number] Date of Birth/Sex: 12-01-1919 (81 y.o. Male) Treating RN: Primary Care Physician: Frazier Richards Other Clinician: Jacqulyn Bath Referring Physician: Frazier Richards Treating Physician/Extender: Frann Rider in Treatment: 8 HBO Safety Checklist Items Safety Checklist Consent Form Signed Patient voided / foley secured and emptied When did you last eato 07:00 am Last dose of injectable or oral agent n/a NA Ostomy pouch emptied and vented if applicable NA All implantable devices assessed, documented and approved NA Intravenous access site secured and place Valuables secured Linens and cotton and cotton/polyester blend (less than 51% polyester) Personal oil-based products / skin lotions / body lotions removed Wigs or hairpieces removed Smoking or tobacco materials removed Books / newspapers / magazines / loose paper removed Cologne, aftershave, perfume and deodorant removed Jewelry removed (may wrap wedding band) Make-up removed Hair care products removed Battery operated devices (external) removed Heating patches and chemical warmers removed NA Titanium eyewear removed NA Nail polish cured greater than 10 hours NA Casting material cured greater than 10 hours Hearing aids removed Loose dentures or partials removed NA Prosthetics have been removed Patient demonstrates correct use of air break device (if applicable) Patient concerns have been addressed Patient grounding bracelet on and [name]  attached to chamber Specifics for Inpatients (complete in addition to above) Medication sheet sent with patient Intravenous medications needed or due during therapy sent with patient [name], [name]. ([id number]) Drainage tubes (e.g. nasogastric tube or chest tube secured and vented) Endotracheal or Tracheotomy tube secured Cuff deflated of air and inflated  with saline Airway suctioned Electronic Signature(s) Signed: 06/19/2015 2:30:55 PM By: Lorine Bears RCP, RRT, CHT Entered By: Becky Sax, Amado Nash on 06/19/2015 09:39:23

## 2015-06-22 ENCOUNTER — Encounter: Payer: Medicare Other | Admitting: Surgery

## 2015-06-22 DIAGNOSIS — N3041 Irradiation cystitis with hematuria: Secondary | ICD-10-CM | POA: Diagnosis not present

## 2015-06-22 NOTE — Progress Notes (Signed)
ZACKRY, ZEPHIR ([id number]) Visit Report for 06/22/2015 Arrival Information Details Patient Name: [name], [name] Date of Service: 06/22/2015 10:00 AM Medical Record Number: [id number] Patient Account Number: [id number] Date of Birth/Sex: 03/07/1920 (81 y.o. Male) Treating RN: Primary Care Physician: Frazier Richards Other Clinician: Jacqulyn Bath Referring Physician: Frazier Richards Treating Physician/Extender: Frann Rider in Treatment: 8 Visit Information History Since Last Visit Added or deleted any medications: No Patient Arrived: Ambulatory Any new allergies or adverse reactions: No Arrival Time: 09:30 Had a fall or experienced change in No Accompanied By: wife activities of daily living that may affect Transfer Assistance: Manual risk of falls: Patient Identification Verified: Yes Signs or symptoms of abuse/neglect since last No Secondary Verification Process Yes visito Completed: Hospitalized since last visit: No Patient Has Alerts: Yes Pain Present Now: No Electronic Signature(s) Signed: 06/22/2015 12:03:34 PM By: Lorine Bears RCP, RRT, CHT Entered By: Becky Sax, Amado Nash on 06/22/2015 09:39:25 Erhardt, [name] ([id number]) -------------------------------------------------------------------------------- Encounter Discharge Information Details Patient Name: [name] Date of Service: 06/22/2015 10:00 AM Medical Record Number: [id number] Patient Account Number: [id number] Date of Birth/Sex: 04-07-1920 (81 y.o. Male) Treating RN: Primary Care Physician: Frazier Richards Other Clinician: Jacqulyn Bath Referring Physician: Frazier Richards Treating Physician/Extender: Frann Rider in Treatment: 8 Encounter Discharge Information Items Discharge Pain Level: 0 Discharge Condition: Stable Ambulatory Status: Ambulatory Discharge Destination: Home Private Transportation: Auto Accompanied By: wife Schedule Follow-up  Appointment: No Medication Reconciliation completed and No provided to Patient/Care Cassidie Veiga: Clinical Summary of Care: Notes Patient has an HBO treatment scheduled on 06/23/15 at 10:00 am. Electronic Signature(s) Signed: 06/22/2015 12:03:34 PM By: Lorine Bears RCP, RRT, CHT Entered By: Lorine Bears on 06/22/2015 12:02:31 Tardif, [name] ([id number]) -------------------------------------------------------------------------------- Vitals Details Patient Name: [name] Date of Service: 06/22/2015 10:00 AM Medical Record Number: [id number] Patient Account Number: [id number] Date of Birth/Sex: 01-Oct-1919 (81 y.o. Male) Treating RN: Primary Care Physician: Frazier Richards Other Clinician: Jacqulyn Bath Referring Physician: Frazier Richards Treating Physician/Extender: Frann Rider in Treatment: 8 Vital Signs Time Taken: 09:30 Temperature (F): 96.9 Height (in): 67 Pulse (bpm): 48 Weight (lbs): 116.8 Respiratory Rate (breaths/min): 18 Body Mass Index (BMI): 18.3 Blood Pressure (mmHg): 120/66 Reference Range: 80 - 120 mg / dl Electronic Signature(s) Signed: 06/22/2015 12:03:34 PM By: Lorine Bears RCP, RRT, CHT Entered By: Lorine Bears on 06/22/2015 09:39:53

## 2015-06-23 ENCOUNTER — Encounter: Payer: Medicare Other | Admitting: Surgery

## 2015-06-23 DIAGNOSIS — N3041 Irradiation cystitis with hematuria: Secondary | ICD-10-CM | POA: Diagnosis not present

## 2015-06-23 NOTE — Progress Notes (Signed)
[name], [name] ([id number]) Visit Report for 06/22/2015 HBO Details Patient Name: [name], [name]. Date of Service: 06/22/2015 10:00 AM Medical Record Number: [id number] Patient Account Number: [id number] Date of Birth/Sex: [birth year] (81 y.o. Male) Treating RN: Primary Care Physician: Frazier Richards Other Clinician: Jacqulyn Bath Referring Physician: Frazier Richards Treating Physician/Extender: Frann Rider in Treatment: 8 HBO Treatment Course Details Treatment Course Ordering Physician: Christin Fudge 1 Number: HBO Treatment Start Date: 05/05/2015 Total Treatments 40 Ordered: HBO Indication: Late Effect of Radiation HBO Treatment Details Treatment Number: 30 Patient Type: Outpatient Chamber Type: Monoplace Chamber #: HBO [id number] Treatment Protocol: 2.0 ATA with 90 minutes oxygen, and no air breaks Treatment Details Compression Rate Down: 1.5 psi / minute De-Compression Rate Up: 1.5 psi / minute Air breaks and breathing Compress Tx Pressure Decompress Decompress periods Begins Reached Begins Ends (leave unused spaces blank) Chamber Pressure 1 ATA 2.0 ATA - - - - - - 2.0 ATA 1 ATA Clock Time (24 hr) 09:52 10:02 - - - - - - 11:33 11:43 Treatment Length: 111 (minutes) Treatment Segments: 4 Capillary Blood Glucose Pre Capillary Blood Glucose (mg/dl): Post Capillary Blood Glucose (mg/dl): Vital Signs Capillary Blood Glucose Reference Range: 80 - 120 mg / dl HBO Diabetic Blood Glucose Intervention Range: <131 mg/dl or >249 mg/dl Time Vitals Blood Respiratory Capillary Blood Glucose Pulse Action Type: Pulse: Temperature: Taken: Pressure: Rate: Glucose (mg/dl): Meter #: Oximetry (%) Taken: Pre 09:30 120/66 48 18 96.9 Post 11:50 124/68 72 18 97.8 Treatment Response Treatment Completion Status: Treatment Completed without Adverse Event [name], [name] ([id number]) HBO Attestation I certify that I supervised this HBO treatment in accordance with Medicare  guidelines. A trained Yes emergency response team is readily available per hospital policies and procedures. Continue HBOT as ordered. Yes Electronic Signature(s) Signed: 06/22/2015 1:23:19 PM By: Christin Fudge MD, FACS Previous Signature: 06/22/2015 12:03:34 PM Version By: Lorine Bears RCP, RRT, CHT Entered By: Christin Fudge on 06/22/2015 13:23:18 [name], [name] ([id number]) -------------------------------------------------------------------------------- HBO Safety Checklist Details Patient Name: [name] Date of Service: 06/22/2015 10:00 AM Medical Record Number: [id number] Patient Account Number: [id number] Date of Birth/Sex: 06/17/1920 (81 y.o. Male) Treating RN: Primary Care Physician: Frazier Richards Other Clinician: Jacqulyn Bath Referring Physician: Frazier Richards Treating Physician/Extender: Frann Rider in Treatment: 8 HBO Safety Checklist Items Safety Checklist Consent Form Signed Patient voided / foley secured and emptied When did you last eato 07:00 am Last dose of injectable or oral agent n/a NA Ostomy pouch emptied and vented if applicable NA All implantable devices assessed, documented and approved NA Intravenous access site secured and place Valuables secured Linens and cotton and cotton/polyester blend (less than 51% polyester) Personal oil-based products / skin lotions / body lotions removed Wigs or hairpieces removed Smoking or tobacco materials removed Books / newspapers / magazines / loose paper removed Cologne, aftershave, perfume and deodorant removed Jewelry removed (may wrap wedding band) Make-up removed Hair care products removed Battery operated devices (external) removed Heating patches and chemical warmers removed NA Titanium eyewear removed NA Nail polish cured greater than 10 hours NA Casting material cured greater than 10 hours Hearing aids removed Loose dentures or partials removed NA Prosthetics have  been removed Patient demonstrates correct use of air break device (if applicable) Patient concerns have been addressed Patient grounding bracelet on and cord attached to chamber Specifics for Inpatients (complete in addition to above) Medication sheet sent with patient Intravenous medications needed or due during therapy sent with patient [name], [name]  W. ([id number]) Drainage tubes (e.g. nasogastric tube or chest tube secured and vented) Endotracheal or Tracheotomy tube secured Cuff deflated of air and inflated with saline Airway suctioned Electronic Signature(s) Signed: 06/22/2015 12:03:34 PM By: Lorine Bears RCP, RRT, CHT Entered By: Lorine Bears on 06/22/2015 09:40:35

## 2015-06-24 ENCOUNTER — Encounter: Payer: Medicare Other | Admitting: Surgery

## 2015-06-24 DIAGNOSIS — N3041 Irradiation cystitis with hematuria: Secondary | ICD-10-CM | POA: Diagnosis not present

## 2015-06-24 NOTE — Progress Notes (Signed)
[name] ([id number]) Visit Report for 06/23/2015 HBO Details Patient Name: [name], [name]. Date of Service: 06/23/2015 10:00 AM Medical Record Number: [id number] Patient Account Number: [id number] Date of Birth/Sex: [birth year] (81 y.o. Male) Treating RN: Primary Care Physician: Frazier Richards Other Clinician: Jacqulyn Bath Referring Physician: Frazier Richards Treating Physician/Extender: Frann Rider in Treatment: 8 HBO Treatment Course Details Treatment Course Ordering Physician: Christin Fudge 1 Number: HBO Treatment Start Date: 05/05/2015 Total Treatments 40 Ordered: HBO Indication: Late Effect of Radiation HBO Treatment Details Treatment Number: 31 Patient Type: Outpatient Chamber Type: Monoplace Chamber #: HBO [id number] Treatment Protocol: 2.0 ATA with 90 minutes oxygen, and no air breaks Treatment Details Compression Rate Down: 1.5 psi / minute De-Compression Rate Up: 1.5 psi / minute Air breaks and breathing Compress Tx Pressure Decompress Decompress periods Begins Reached Begins Ends (leave unused spaces blank) Chamber Pressure 1 ATA 2.0 ATA - - - - - - 2.0 ATA 1 ATA Clock Time (24 hr) 09:58 10:09 - - - - - - 11:39 11:49 Treatment Length: 111 (minutes) Treatment Segments: 4 Capillary Blood Glucose Pre Capillary Blood Glucose (mg/dl): Post Capillary Blood Glucose (mg/dl): Vital Signs Capillary Blood Glucose Reference Range: 80 - 120 mg / dl HBO Diabetic Blood Glucose Intervention Range: <131 mg/dl or >249 mg/dl Time Vitals Blood Respiratory Capillary Blood Glucose Pulse Action Type: Pulse: Temperature: Taken: Pressure: Rate: Glucose (mg/dl): Meter #: Oximetry (%) Taken: Pre 09:40 136/74 60 18 97.1 Post 11:53 142/68 66 18 98.1 Treatment Response Treatment Completion Status: Treatment Completed without Adverse Event Ronda, Norvin W. ([id number]) HBO Attestation I certify that I supervised this HBO treatment in accordance with Medicare  guidelines. A trained Yes emergency response team is readily available per hospital policies and procedures. Continue HBOT as ordered. Yes Electronic Signature(s) Signed: 06/23/2015 12:41:05 PM By: Christin Fudge MD, FACS Entered By: Christin Fudge on 06/23/2015 12:41:05 Clyne, [name] ([id number]) -------------------------------------------------------------------------------- HBO Safety Checklist Details Patient Name: [name] Date of Service: 06/23/2015 10:00 AM Medical Record Number: [id number] Patient Account Number: [id number] Date of Birth/Sex: 1919/12/14 (81 y.o. Male) Treating RN: Primary Care Physician: Frazier Richards Other Clinician: Jacqulyn Bath Referring Physician: Frazier Richards Treating Physician/Extender: Frann Rider in Treatment: 8 HBO Safety Checklist Items Safety Checklist Consent Form Signed Patient voided / foley secured and emptied When did you last eato 07:00 am Last dose of injectable or oral agent n/a NA Ostomy pouch emptied and vented if applicable NA All implantable devices assessed, documented and approved NA Intravenous access site secured and place Valuables secured Linens and cotton and cotton/polyester blend (less than 51% polyester) Personal oil-based products / skin lotions / body lotions removed Wigs or hairpieces removed Smoking or tobacco materials removed Books / newspapers / magazines / loose paper removed Cologne, aftershave, perfume and deodorant removed Jewelry removed (may wrap wedding band) Make-up removed Hair care products removed Battery operated devices (external) removed Heating patches and chemical warmers removed NA Titanium eyewear removed NA Nail polish cured greater than 10 hours NA Casting material cured greater than 10 hours Hearing aids removed Loose dentures or partials removed NA Prosthetics have been removed Patient demonstrates correct use of air break device (if applicable) Patient  concerns have been addressed Patient grounding bracelet on and cord attached to chamber Specifics for Inpatients (complete in addition to above) Medication sheet sent with patient Intravenous medications needed or due during therapy sent with patient ORIA, SIMON. ([id number]) Drainage tubes (e.g. nasogastric tube or chest tube secured and vented)  Endotracheal or Tracheotomy tube secured Cuff deflated of air and inflated with saline Airway suctioned Electronic Signature(s) Signed: 06/23/2015 1:47:12 PM By: Lorine Bears RCP, RRT, CHT Entered By: Lorine Bears on 06/23/2015 09:49:51

## 2015-06-24 NOTE — Progress Notes (Signed)
YAZIR, NANNINI ([id number]) Visit Report for 06/23/2015 Arrival Information Details Patient Name: [name], [name] Date of Service: 06/23/2015 10:00 AM Medical Record Number: [id number] Patient Account Number: [id number] Date of Birth/Sex: 09/04/1919 (81 y.o. Male) Treating RN: Primary Care Physician: Frazier Richards Other Clinician: Jacqulyn Bath Referring Physician: Frazier Richards Treating Physician/Extender: Frann Rider in Treatment: 8 Visit Information History Since Last Visit Added or deleted any medications: No Patient Arrived: Ambulatory Any new allergies or adverse reactions: No Arrival Time: 09:40 Had a fall or experienced change in No Accompanied By: self activities of daily living that may affect Transfer Assistance: Manual risk of falls: Patient Identification Verified: Yes Signs or symptoms of abuse/neglect since last No Secondary Verification Process Yes visito Completed: Hospitalized since last visit: No Patient Has Alerts: Yes Pain Present Now: No Electronic Signature(s) Signed: 06/23/2015 1:47:12 PM By: Lorine Bears RCP, RRT, CHT Entered By: Becky Sax, Amado Nash on 06/23/2015 09:47:43 [name], [name] ([id number]) -------------------------------------------------------------------------------- Encounter Discharge Information Details Patient Name: [name] Date of Service: 06/23/2015 10:00 AM Medical Record Number: [id number] Patient Account Number: [id number] Date of Birth/Sex: [birth year] (81 y.o. Male) Treating RN: Primary Care Physician: Frazier Richards Other Clinician: Jacqulyn Bath Referring Physician: Frazier Richards Treating Physician/Extender: Frann Rider in Treatment: 8 Encounter Discharge Information Items Discharge Pain Level: 0 Discharge Condition: Stable Ambulatory Status: Ambulatory Discharge Destination: Home Private Transportation: Auto Accompanied By: self Schedule Follow-up  Appointment: No Medication Reconciliation completed and No provided to Patient/Care Carie Kapuscinski: Clinical Summary of Care: Notes Patient has an HBO treatment scheduled on 06/24/15 at 10:00 am. Electronic Signature(s) Signed: 06/23/2015 1:47:12 PM By: Lorine Bears RCP, RRT, CHT Entered By: Becky Sax, Amado Nash on 06/23/2015 12:05:53 Northrop, [name] ([id number]) -------------------------------------------------------------------------------- Vitals Details Patient Name: [name] Date of Service: 06/23/2015 10:00 AM Medical Record Number: [id number] Patient Account Number: [id number] Date of Birth/Sex: 27-Jan-1920 (81 y.o. Male) Treating RN: Primary Care Physician: Frazier Richards Other Clinician: Jacqulyn Bath Referring Physician: Frazier Richards Treating Physician/Extender: Frann Rider in Treatment: 8 Vital Signs Time Taken: 09:40 Temperature (F): 97.1 Height (in): 67 Pulse (bpm): 60 Weight (lbs): 116.8 Respiratory Rate (breaths/min): 18 Body Mass Index (BMI): 18.3 Blood Pressure (mmHg): 136/74 Reference Range: 80 - 120 mg / dl Electronic Signature(s) Signed: 06/23/2015 1:47:12 PM By: Lorine Bears RCP, RRT, CHT Entered By: Becky Sax, Amado Nash on 06/23/2015 09:49:04

## 2015-06-25 ENCOUNTER — Encounter: Payer: Medicare Other | Admitting: Surgery

## 2015-06-25 DIAGNOSIS — N3041 Irradiation cystitis with hematuria: Secondary | ICD-10-CM | POA: Diagnosis not present

## 2015-06-25 NOTE — Progress Notes (Signed)
CHRISSHAWN, FOREE ([id number]) Visit Report for 06/24/2015 Arrival Information Details Patient Name: CAYCEN, NODAL Date of Service: 06/24/2015 10:00 AM Medical Record Number: [id number] Patient Account Number: [id number] Date of Birth/Sex: [birth year] (81 y.o. Male) Treating RN: Primary Care Physician: Frazier Richards Other Clinician: Jacqulyn Bath Referring Physician: Frazier Richards Treating Physician/Extender: BURNS III, Charlean Sanfilippo in Treatment: 8 Visit Information History Since Last Visit Added or deleted any medications: No Patient Arrived: Kasandra Knudsen Any new allergies or adverse reactions: No Arrival Time: 09:40 Had a fall or experienced change in No Accompanied By: wife activities of daily living that may affect Transfer Assistance: Manual risk of falls: Patient Identification Verified: Yes Signs or symptoms of abuse/neglect since last No Secondary Verification Process Yes visito Completed: Hospitalized since last visit: No Patient Has Alerts: Yes Pain Present Now: No Electronic Signature(s) Signed: 06/24/2015 1:00:55 PM By: Lorine Bears RCP, RRT, CHT Entered By: Becky Sax, Amado Nash on 06/24/2015 09:42:35 Wombles, Ferris W. ([id number]) -------------------------------------------------------------------------------- Encounter Discharge Information Details Patient Name: [name] Date of Service: 06/24/2015 10:00 AM Medical Record Number: [id number] Patient Account Number: [id number] Date of Birth/Sex: 11/25/1919 (81 y.o. Male) Treating RN: Primary Care Physician: Frazier Richards Other Clinician: Jacqulyn Bath Referring Physician: Frazier Richards Treating Physician/Extender: BURNS III, Charlean Sanfilippo in Treatment: 8 Encounter Discharge Information Items Discharge Pain Level: 0 Discharge Condition: Stable Ambulatory Status: Ambulatory Discharge Destination: Home Private Transportation: Auto Accompanied By: wife Schedule Follow-up  Appointment: No Medication Reconciliation completed and No provided to Patient/Care Machael Raine: Clinical Summary of Care: Notes Patient has an HBO treatment scheduled on 06/25/15 at 10:00 am. Electronic Signature(s) Signed: 06/24/2015 1:00:55 PM By: Lorine Bears RCP, RRT, CHT Entered By: Becky Sax, Amado Nash on 06/24/2015 12:10:52 [place], [name] ([id number]) -------------------------------------------------------------------------------- Vitals Details Patient Name: [name] Date of Service: 06/24/2015 10:00 AM Medical Record Number: [id number] Patient Account Number: [id number] Date of Birth/Sex: 1919/10/23 (81 y.o. Male) Treating RN: Primary Care Physician: Frazier Richards Other Clinician: Jacqulyn Bath Referring Physician: Frazier Richards Treating Physician/Extender: BURNS III, Charlean Sanfilippo in Treatment: 8 Vital Signs Time Taken: 09:40 Temperature (F): 97.7 Height (in): 67 Pulse (bpm): 66 Weight (lbs): 116.8 Respiratory Rate (breaths/min): 18 Body Mass Index (BMI): 18.3 Blood Pressure (mmHg): 112/66 Reference Range: 80 - 120 mg / dl Electronic Signature(s) Signed: 06/24/2015 1:00:55 PM By: Lorine Bears RCP, RRT, CHT Entered By: Lorine Bears on 06/24/2015 09:43:00

## 2015-06-25 NOTE — Progress Notes (Signed)
[name], [name] ([id number]) Visit Report for 06/24/2015 HBO Details Patient Name: [name], [name]. Date of Service: 06/24/2015 10:00 AM Medical Record Number: [id number] Patient Account Number: [id number] Date of Birth/Sex: 10/25/1919 (81 y.o. Male) Treating RN: Primary Care Physician: Frazier Richards Other Clinician: Jacqulyn Bath Referring Physician: Frazier Richards Treating Physician/Extender: BURNS III, Charlean Sanfilippo in Treatment: 8 HBO Treatment Course Details Treatment Course Ordering Physician: Christin Fudge 1 Number: HBO Treatment Start Date: 05/05/2015 Total Treatments 40 Ordered: HBO Indication: Late Effect of Radiation HBO Treatment Details Treatment Number: 32 Patient Type: Outpatient Chamber Type: Monoplace Chamber #: HBO [id number] Treatment Protocol: 2.0 ATA with 90 minutes oxygen, and no air breaks Treatment Details Compression Rate Down: 1.5 psi / minute De-Compression Rate Up: 1.5 psi / minute Air breaks and breathing Compress Tx Pressure Decompress Decompress periods Begins Reached Begins Ends (leave unused spaces blank) Chamber Pressure 1 ATA 2.0 ATA - - - - - - 2.0 ATA 1 ATA Clock Time (24 hr) 09:59 10:10 - - - - - - 11:40 11:50 Treatment Length: 111 (minutes) Treatment Segments: 4 Capillary Blood Glucose Pre Capillary Blood Glucose (mg/dl): Post Capillary Blood Glucose (mg/dl): Vital Signs Capillary Blood Glucose Reference Range: 80 - 120 mg / dl HBO Diabetic Blood Glucose Intervention Range: <131 mg/dl or >249 mg/dl Time Vitals Blood Respiratory Capillary Blood Glucose Pulse Action Type: Pulse: Temperature: Taken: Pressure: Rate: Glucose (mg/dl): Meter #: Oximetry (%) Taken: Pre 09:40 112/66 66 18 97.7 Post 11:55 122/66 42 18 98.6 Treatment Response Treatment Completion Status: Treatment Completed without Adverse Event Freshour, Jhoan W. ([id number]) HBO Attestation I certify that I supervised this HBO treatment in accordance with  Medicare guidelines. A trained Yes emergency response team is readily available per hospital policies and procedures. Continue HBOT as ordered. Yes Electronic Signature(s) Signed: 06/24/2015 4:30:30 PM By: Loletha Grayer MD Entered By: Loletha Grayer on 06/24/2015 12:50:36 [place], [name] ([id number]) -------------------------------------------------------------------------------- HBO Safety Checklist Details Patient Name: [name] Date of Service: 06/24/2015 10:00 AM Medical Record Number: [id number] Patient Account Number: [id number] Date of Birth/Sex: 06-22-1920 (81 y.o. Male) Treating RN: Primary Care Physician: Frazier Richards Other Clinician: Jacqulyn Bath Referring Physician: Frazier Richards Treating Physician/Extender: BURNS III, Charlean Sanfilippo in Treatment: 8 HBO Safety Checklist Items Safety Checklist Consent Form Signed Patient voided / foley secured and emptied When did you last eato 07:00 am Last dose of injectable or oral agent n/a NA Ostomy pouch emptied and vented if applicable NA All implantable devices assessed, documented and approved NA Intravenous access site secured and place Valuables secured Linens and cotton and cotton/polyester blend (less than 51% polyester) Personal oil-based products / skin lotions / body lotions removed Wigs or hairpieces removed Smoking or tobacco materials removed Books / newspapers / magazines / loose paper removed Cologne, aftershave, perfume and deodorant removed Jewelry removed (may wrap wedding band) Make-up removed Hair care products removed Battery operated devices (external) removed Heating patches and chemical warmers removed NA Titanium eyewear removed NA Nail polish cured greater than 10 hours NA Casting material cured greater than 10 hours Hearing aids removed Loose dentures or partials removed NA Prosthetics have been removed Patient demonstrates correct use of air break device (if  applicable) Patient concerns have been addressed Patient grounding bracelet on and cord attached to chamber Specifics for Inpatients (complete in addition to above) Medication sheet sent with patient Intravenous medications needed or due during therapy sent with patient CHESKEL, QUISPE. ([id number]) Drainage tubes (e.g. nasogastric tube or chest tube  secured and vented) Endotracheal or Tracheotomy tube secured Cuff deflated of air and inflated with saline Airway suctioned Electronic Signature(s) Signed: 06/24/2015 1:00:55 PM By: Lorine Bears RCP, RRT, CHT Entered By: Lorine Bears on 06/24/2015 09:43:40

## 2015-06-26 ENCOUNTER — Encounter: Payer: Medicare Other | Admitting: Surgery

## 2015-06-26 DIAGNOSIS — N3041 Irradiation cystitis with hematuria: Secondary | ICD-10-CM | POA: Diagnosis not present

## 2015-06-26 NOTE — Progress Notes (Signed)
[name] ([id number]) Visit Report for 06/25/2015 HBO Details Patient Name: [name], [name]. Date of Service: 06/25/2015 10:00 AM Medical Record Number: [id number] Patient Account Number: [id number] Date of Birth/Sex: 1920-06-29 (81 y.o. Male) Treating RN: Primary Care Physician: Frazier Richards Other Clinician: Jacqulyn Bath Referring Physician: Frazier Richards Treating Physician/Extender: Frann Rider in Treatment: 9 HBO Treatment Course Details Treatment Course Ordering Physician: Christin Fudge 1 Number: HBO Treatment Start Date: 05/05/2015 Total Treatments 40 Ordered: HBO Indication: Late Effect of Radiation HBO Treatment Details Treatment Number: 33 Patient Type: Outpatient Chamber Type: Monoplace Chamber #: HBO [id number] Treatment Protocol: 2.0 ATA with 90 minutes oxygen, and no air breaks Treatment Details Compression Rate Down: 1.5 psi / minute De-Compression Rate Up: 1.5 psi / minute Air breaks and breathing Compress Tx Pressure Decompress Decompress periods Begins Reached Begins Ends (leave unused spaces blank) Chamber Pressure 1 ATA 2.0 ATA - - - - - - 2.0 ATA 1 ATA Clock Time (24 hr) 09:51 10:01 - - - - - - 11:31 11:41 Treatment Length: 110 (minutes) Treatment Segments: 4 Capillary Blood Glucose Pre Capillary Blood Glucose (mg/dl): Post Capillary Blood Glucose (mg/dl): Vital Signs Capillary Blood Glucose Reference Range: 80 - 120 mg / dl HBO Diabetic Blood Glucose Intervention Range: <131 mg/dl or >249 mg/dl Time Vitals Blood Respiratory Capillary Blood Glucose Pulse Action Type: Pulse: Temperature: Taken: Pressure: Rate: Glucose (mg/dl): Meter #: Oximetry (%) Taken: Pre 09:30 114/62 60 18 98.3 Post 11:43 120/66 48 18 98.8 Treatment Response Treatment Completion Status: Treatment Completed without Adverse Event [name], [name] ([id number]) HBO Attestation I certify that I supervised this HBO treatment in accordance with Medicare  guidelines. A trained Yes emergency response team is readily available per hospital policies and procedures. Continue HBOT as ordered. Yes Electronic Signature(s) Signed: 06/25/2015 12:06:29 PM By: Christin Fudge MD, FACS Entered By: Christin Fudge on 06/25/2015 12:06:29 [name] ([id number]) -------------------------------------------------------------------------------- HBO Safety Checklist Details Patient Name: [name] Date of Service: 06/25/2015 10:00 AM Medical Record Number: [id number] Patient Account Number: [id number] Date of Birth/Sex: 1920/03/18 (81 y.o. Male) Treating RN: Primary Care Physician: Frazier Richards Other Clinician: Jacqulyn Bath Referring Physician: Frazier Richards Treating Physician/Extender: Frann Rider in Treatment: 9 HBO Safety Checklist Items Safety Checklist Consent Form Signed Patient voided / foley secured and emptied When did you last eato 07:00 am Last dose of injectable or oral agent n/a NA Ostomy pouch emptied and vented if applicable NA All implantable devices assessed, documented and approved NA Intravenous access site secured and place Valuables secured Linens and cotton and cotton/polyester blend (less than 51% polyester) Personal oil-based products / skin lotions / body lotions removed Wigs or hairpieces removed Smoking or tobacco materials removed Books / newspapers / magazines / loose paper removed Cologne, aftershave, perfume and deodorant removed Jewelry removed (may wrap wedding band) Make-up removed Hair care products removed Battery operated devices (external) removed Heating patches and chemical warmers removed NA Titanium eyewear removed NA Nail polish cured greater than 10 hours NA Casting material cured greater than 10 hours Hearing aids removed Loose dentures or partials removed NA Prosthetics have been removed Patient demonstrates correct use of air break device (if applicable) Patient  concerns have been addressed Patient grounding bracelet on and cord attached to chamber Specifics for Inpatients (complete in addition to above) Medication sheet sent with patient Intravenous medications needed or due during therapy sent with patient [name]. ([id number]) Drainage tubes (e.g. nasogastric tube or chest tube secured and vented)  Endotracheal or Tracheotomy tube secured Cuff deflated of air and inflated with saline Airway suctioned Electronic Signature(s) Signed: 06/25/2015 12:10:09 PM By: Lorine Bears RCP, RRT, CHT Entered By: Lorine Bears on 06/25/2015 09:36:40

## 2015-06-26 NOTE — Progress Notes (Signed)
[name], [name] ([id number]) Visit Report for 06/25/2015 Arrival Information Details Patient Name: [name], [name] Date of Service: 06/25/2015 10:00 AM Medical Record Number: [id number] Patient Account Number: [id number] Date of Birth/Sex: [birth year] (81 y.o. Male) Treating RN: Primary Care Physician: Frazier Richards Other Clinician: Jacqulyn Bath Referring Physician: Frazier Richards Treating Physician/Extender: Frann Rider in Treatment: 9 Visit Information History Since Last Visit Added or deleted any medications: No Patient Arrived: Ambulatory Any new allergies or adverse reactions: No Arrival Time: 09:30 Had a fall or experienced change in No Accompanied By: self activities of daily living that may affect Transfer Assistance: Manual risk of falls: Patient Identification Verified: Yes Signs or symptoms of abuse/neglect since last No Secondary Verification Process Yes visito Completed: Hospitalized since last visit: No Patient Has Alerts: Yes Pain Present Now: No Electronic Signature(s) Signed: 06/25/2015 12:10:09 PM By: Lorine Bears RCP, RRT, CHT Entered By: Becky Sax, Amado Nash on 06/25/2015 09:35:23 [place], [name] ([id number]) -------------------------------------------------------------------------------- Encounter Discharge Information Details Patient Name: [name] Date of Service: 06/25/2015 10:00 AM Medical Record Number: [id number] Patient Account Number: [id number] Date of Birth/Sex: [birth year] (81 y.o. Male) Treating RN: Primary Care Physician: Frazier Richards Other Clinician: Jacqulyn Bath Referring Physician: Frazier Richards Treating Physician/Extender: Frann Rider in Treatment: 9 Encounter Discharge Information Items Discharge Pain Level: 0 Discharge Condition: Stable Ambulatory Status: Ambulatory Discharge Destination: Home Private Transportation: Auto Accompanied By: self Schedule Follow-up  Appointment: No Medication Reconciliation completed and No provided to Patient/Care Torey Reinard: Clinical Summary of Care: Notes Patient has an HBO treatment scheduled on 06/26/15 at 10:00 am. Electronic Signature(s) Signed: 06/25/2015 12:10:09 PM By: Lorine Bears RCP, RRT, CHT Entered By: Becky Sax, Amado Nash on 06/25/2015 11:56:30 [place], [name] ([id number]) -------------------------------------------------------------------------------- Vitals Details Patient Name: [name] Date of Service: 06/25/2015 10:00 AM Medical Record Number: [id number] Patient Account Number: [id number] Date of Birth/Sex: 10/09/1919 (81 y.o. Male) Treating RN: Primary Care Physician: Frazier Richards Other Clinician: Jacqulyn Bath Referring Physician: Frazier Richards Treating Physician/Extender: Frann Rider in Treatment: 9 Vital Signs Time Taken: 09:30 Temperature (F): 98.3 Height (in): 67 Pulse (bpm): 60 Weight (lbs): 116.8 Respiratory Rate (breaths/min): 18 Body Mass Index (BMI): 18.3 Blood Pressure (mmHg): 114/62 Reference Range: 80 - 120 mg / dl Electronic Signature(s) Signed: 06/25/2015 12:10:09 PM By: Lorine Bears RCP, RRT, CHT Entered By: Lorine Bears on 06/25/2015 09:35:45

## 2015-06-27 NOTE — Progress Notes (Signed)
[name], [name] ([id number]) Visit Report for 06/26/2015 Arrival Information Details Patient Name: [name], [name] Date of Service: 06/26/2015 10:00 AM Medical Record Number: [id number] Patient Account Number: [id number] Date of Birth/Sex: 1919/10/22 (81 y.o. Male) Treating RN: Primary Care Physician: Frazier Richards Other Clinician: Jacqulyn Bath Referring Physician: Frazier Richards Treating Physician/Extender: Frann Rider in Treatment: 9 Visit Information History Since Last Visit Added or deleted any medications: No Patient Arrived: Ambulatory Any new allergies or adverse reactions: No Arrival Time: 09:35 Had a fall or experienced change in No Accompanied By: self activities of daily living that may affect Transfer Assistance: Manual risk of falls: Patient Identification Verified: Yes Signs or symptoms of abuse/neglect since last No Secondary Verification Process Yes visito Completed: Hospitalized since last visit: No Patient Has Alerts: Yes Pain Present Now: No Electronic Signature(s) Signed: 06/26/2015 2:06:50 PM By: Lorine Bears RCP, RRT, CHT Entered By: Becky Sax, Amado Nash on 06/26/2015 09:45:30 [name], [name] ([id number]) -------------------------------------------------------------------------------- Encounter Discharge Information Details Patient Name: [name] Date of Service: 06/26/2015 10:00 AM Medical Record Number: [id number] Patient Account Number: [id number] Date of Birth/Sex: 03/08/1920 (81 y.o. Male) Treating RN: Primary Care Physician: Frazier Richards Other Clinician: Jacqulyn Bath Referring Physician: Frazier Richards Treating Physician/Extender: Frann Rider in Treatment: 9 Encounter Discharge Information Items Discharge Pain Level: 0 Discharge Condition: Stable Ambulatory Status: Ambulatory Discharge Destination: Home Transportation: Private Auto wife and Accompanied By: daughter Schedule  Follow-up Appointment: No Medication Reconciliation completed No and provided to Patient/Care Ilya Ess: Clinical Summary of Care: Notes Patient has an HBO treatment scheduled on 06/29/15 at 10:00 am. Electronic Signature(s) Signed: 06/26/2015 2:06:50 PM By: Lorine Bears RCP, RRT, CHT Entered By: Lorine Bears on 06/26/2015 12:09:09 [name] ([id number]) -------------------------------------------------------------------------------- Vitals Details Patient Name: [name] Date of Service: 06/26/2015 10:00 AM Medical Record Number: [id number] Patient Account Number: [id number] Date of Birth/Sex: [birth year] (81 y.o. Male) Treating RN: Primary Care Physician: Frazier Richards Other Clinician: Jacqulyn Bath Referring Physician: Frazier Richards Treating Physician/Extender: Frann Rider in Treatment: 9 Vital Signs Time Taken: 09:35 Temperature (F): 98.4 Height (in): 67 Pulse (bpm): 54 Weight (lbs): 116.8 Respiratory Rate (breaths/min): 18 Body Mass Index (BMI): 18.3 Blood Pressure (mmHg): 108/64 Reference Range: 80 - 120 mg / dl Electronic Signature(s) Signed: 06/26/2015 2:06:50 PM By: Lorine Bears RCP, RRT, CHT Entered By: Lorine Bears on 06/26/2015 09:45:54

## 2015-06-28 NOTE — Progress Notes (Signed)
[name], [name] ([id number]) Visit Report for 06/26/2015 HBO Details Patient Name: [name], [name]. Date of Service: 06/26/2015 10:00 AM Medical Record Number: [id number] Patient Account Number: [id number] Date of Birth/Sex: [birth year] (81 y.o. Male) Treating RN: Primary Care Physician: Frazier Richards Other Clinician: Jacqulyn Bath Referring Physician: Frazier Richards Treating Physician/Extender: Frann Rider in Treatment: 9 HBO Treatment Course Details Treatment Course Ordering Physician: Christin Fudge 1 Number: HBO Treatment Start Date: 05/05/2015 Total Treatments 40 Ordered: HBO Indication: Late Effect of Radiation HBO Treatment Details Treatment Number: 34 Patient Type: Outpatient Chamber Type: Monoplace Chamber #: HBO [id number] Treatment Protocol: 2.0 ATA with 90 minutes oxygen, and no air breaks Treatment Details Compression Rate Down: 1.5 psi / minute De-Compression Rate Up: 1.5 psi / minute Air breaks and breathing Compress Tx Pressure Decompress Decompress periods Begins Reached Begins Ends (leave unused spaces blank) Chamber Pressure 1 ATA 2.0 ATA - - - - - - 2.0 ATA 1 ATA Clock Time (24 hr) 09:55 10:05 - - - - - - 11:36 11:46 Treatment Length: 111 (minutes) Treatment Segments: 4 Capillary Blood Glucose Pre Capillary Blood Glucose (mg/dl): Post Capillary Blood Glucose (mg/dl): Vital Signs Capillary Blood Glucose Reference Range: 80 - 120 mg / dl HBO Diabetic Blood Glucose Intervention Range: <131 mg/dl or >249 mg/dl Time Vitals Blood Respiratory Capillary Blood Glucose Pulse Action Type: Pulse: Temperature: Taken: Pressure: Rate: Glucose (mg/dl): Meter #: Oximetry (%) Taken: Pre 09:35 108/64 54 18 98.4 Post 11:50 112//62 60 18 97.1 Treatment Response Treatment Completion Status: Treatment Completed without Adverse Event TALHAH, LOCKETT ([id number]) Electronic Signature(s) Signed: 06/26/2015 2:06:50 PM By: Lorine Bears RCP,  RRT, CHT Signed: 06/26/2015 4:42:27 PM By: Christin Fudge MD, FACS Previous Signature: 06/26/2015 11:49:12 AM Version By: Christin Fudge MD, FACS Entered By: Lorine Bears on 06/26/2015 12:08:24 [name] ([id number]) -------------------------------------------------------------------------------- HBO Safety Checklist Details Patient Name: [name] Date of Service: 06/26/2015 10:00 AM Medical Record Number: [id number] Patient Account Number: [id number] Date of Birth/Sex: 1919/09/07 (81 y.o. Male) Treating RN: Primary Care Physician: Frazier Richards Other Clinician: Jacqulyn Bath Referring Physician: Frazier Richards Treating Physician/Extender: Frann Rider in Treatment: 9 HBO Safety Checklist Items Safety Checklist Consent Form Signed Patient voided / foley secured and emptied When did you last eato 07:00 am Last dose of injectable or oral agent n/a NA Ostomy pouch emptied and vented if applicable NA All implantable devices assessed, documented and approved NA Intravenous access site secured and place Valuables secured Linens and cotton and cotton/polyester blend (less than 51% polyester) Personal oil-based products / skin lotions / body lotions removed Wigs or hairpieces removed Smoking or tobacco materials removed Books / newspapers / magazines / loose paper removed Cologne, aftershave, perfume and deodorant removed Jewelry removed (may wrap wedding band) Make-up removed Hair care products removed Battery operated devices (external) removed Heating patches and chemical warmers removed NA Titanium eyewear removed NA Nail polish cured greater than 10 hours NA Casting material cured greater than 10 hours Hearing aids removed Loose dentures or partials removed NA Prosthetics have been removed Patient demonstrates correct use of air break device (if applicable) Patient concerns have been addressed Patient grounding bracelet on and cord  attached to chamber Specifics for Inpatients (complete in addition to above) Medication sheet sent with patient Intravenous medications needed or due during therapy sent with patient JOEMAR, KELLAR. ([id number]) Drainage tubes (e.g. nasogastric tube or chest tube secured and vented) Endotracheal or Tracheotomy tube secured Cuff deflated of air and inflated  with saline Airway suctioned Electronic Signature(s) Signed: 06/26/2015 2:06:50 PM By: Lorine Bears RCP, RRT, CHT Entered By: Lorine Bears on 06/26/2015 09:46:40

## 2015-06-29 ENCOUNTER — Encounter: Payer: Medicare Other | Admitting: Surgery

## 2015-06-29 DIAGNOSIS — N3041 Irradiation cystitis with hematuria: Secondary | ICD-10-CM | POA: Diagnosis not present

## 2015-06-29 NOTE — Progress Notes (Signed)
PRATHEEK, RIDEOUT ([id number]) Visit Report for 06/29/2015 Arrival Information Details Patient Name: [name], [name] Date of Service: 06/29/2015 10:00 AM Medical Record Number: [id number] Patient Account Number: [id number] Date of Birth/Sex: July 07, 1920 (81 y.o. Male) Treating RN: Primary Care Physician: Frazier Richards Other Clinician: Jacqulyn Bath Referring Physician: Frazier Richards Treating Physician/Extender: Frann Rider in Treatment: 9 Visit Information History Since Last Visit Added or deleted any medications: No Patient Arrived: Ambulatory Any new allergies or adverse reactions: No Arrival Time: 09:40 Had a fall or experienced change in No Accompanied By: self activities of daily living that may affect Transfer Assistance: Manual risk of falls: Patient Identification Verified: Yes Signs or symptoms of abuse/neglect since last No Secondary Verification Process Yes visito Completed: Hospitalized since last visit: No Patient Has Alerts: Yes Pain Present Now: No Electronic Signature(s) Signed: 06/29/2015 1:25:08 PM By: Lorine Bears RCP, RRT, CHT Entered By: Becky Sax, Amado Nash on 06/29/2015 09:44:56 Doell, Jameal W. ([id number]) -------------------------------------------------------------------------------- Encounter Discharge Information Details Patient Name: [name] Date of Service: 06/29/2015 10:00 AM Medical Record Number: [id number] Patient Account Number: [id number] Date of Birth/Sex: 08-02-1920 (81 y.o. Male) Treating RN: Primary Care Physician: Frazier Richards Other Clinician: Jacqulyn Bath Referring Physician: Frazier Richards Treating Physician/Extender: Frann Rider in Treatment: 9 Encounter Discharge Information Items Discharge Pain Level: 0 Discharge Condition: Stable Ambulatory Status: Ambulatory Discharge Destination: Home Private Transportation: Auto Accompanied By: self Schedule Follow-up  Appointment: No Medication Reconciliation completed and No provided to Patient/Care Nimco Bivens: Clinical Summary of Care: Notes Patient has an HBO treatment scheduled on 06/30/15 at 10:00 am. Electronic Signature(s) Signed: 06/29/2015 1:25:08 PM By: Lorine Bears RCP, RRT, CHT Entered By: Lorine Bears on 06/29/2015 12:09:00 Polizzi, [name] ([id number]) -------------------------------------------------------------------------------- Vitals Details Patient Name: [name] Date of Service: 06/29/2015 10:00 AM Medical Record Number: [id number] Patient Account Number: [id number] Date of Birth/Sex: 1920-03-20 (81 y.o. Male) Treating RN: Primary Care Physician: Frazier Richards Other Clinician: Jacqulyn Bath Referring Physician: Frazier Richards Treating Physician/Extender: Frann Rider in Treatment: 9 Vital Signs Time Taken: 09:40 Temperature (F): 97.9 Height (in): 67 Pulse (bpm): 60 Weight (lbs): 116.8 Respiratory Rate (breaths/min): 18 Body Mass Index (BMI): 18.3 Blood Pressure (mmHg): 138/76 Reference Range: 80 - 120 mg / dl Electronic Signature(s) Signed: 06/29/2015 1:25:08 PM By: Lorine Bears RCP, RRT, CHT Entered By: Lorine Bears on 06/29/2015 09:45:41

## 2015-06-29 NOTE — Progress Notes (Signed)
[name], [name] ([id number]) Visit Report for 06/29/2015 HBO Details Patient Name: [name], [name]. Date of Service: 06/29/2015 10:00 AM Medical Record Number: [id number] Patient Account Number: [id number] Date of Birth/Sex: 04-Nov-1919 (81 y.o. Male) Treating RN: Primary Care Physician: Frazier Richards Other Clinician: Jacqulyn Bath Referring Physician: Frazier Richards Treating Physician/Extender: Frann Rider in Treatment: 9 HBO Treatment Course Details Treatment Course Ordering Physician: Christin Fudge 1 Number: HBO Treatment Start Date: 05/05/2015 Total Treatments 40 Ordered: HBO Indication: Late Effect of Radiation HBO Treatment Details Treatment Number: 35 Patient Type: Outpatient Chamber Type: Monoplace Chamber #: HBO [id number] Treatment Protocol: 2.0 ATA with 90 minutes oxygen, and no air breaks Treatment Details Compression Rate Down: 1.5 psi / minute De-Compression Rate Up: 1.5 psi / minute Air breaks and breathing Compress Tx Pressure Decompress Decompress periods Begins Reached Begins Ends (leave unused spaces blank) Chamber Pressure 1 ATA 2.0 ATA - - - - - - 2.0 ATA 1 ATA Clock Time (24 hr) 09:56 10:08 - - - - - - 11:38 11:50 Treatment Length: 114 (minutes) Treatment Segments: 4 Capillary Blood Glucose Pre Capillary Blood Glucose (mg/dl): Post Capillary Blood Glucose (mg/dl): Vital Signs Capillary Blood Glucose Reference Range: 80 - 120 mg / dl HBO Diabetic Blood Glucose Intervention Range: <131 mg/dl or >249 mg/dl Time Vitals Blood Respiratory Capillary Blood Glucose Pulse Action Type: Pulse: Temperature: Taken: Pressure: Rate: Glucose (mg/dl): Meter #: Oximetry (%) Taken: Pre 09:40 138/76 60 18 97.9 Post 11:53 120/64 54 18 97.6 Treatment Response Treatment Completion Status: Treatment Completed without Adverse Event Ricchio, Hays W. ([id number]) HBO Attestation I certify that I supervised this HBO treatment in accordance with Medicare  guidelines. A trained Yes emergency response team is readily available per hospital policies and procedures. Continue HBOT as ordered. Yes Electronic Signature(s) Signed: 06/29/2015 12:25:54 PM By: Christin Fudge MD, FACS Entered By: Christin Fudge on 06/29/2015 12:25:54 Azimi, [name] ([id number]) -------------------------------------------------------------------------------- HBO Safety Checklist Details Patient Name: [name] Date of Service: 06/29/2015 10:00 AM Medical Record Number: [id number] Patient Account Number: [id number] Date of Birth/Sex: 1919-08-17 (81 y.o. Male) Treating RN: Primary Care Physician: Frazier Richards Other Clinician: Jacqulyn Bath Referring Physician: Frazier Richards Treating Physician/Extender: Frann Rider in Treatment: 9 HBO Safety Checklist Items Safety Checklist Consent Form Signed Patient voided / foley secured and emptied When did you last eato 07:00 am Last dose of injectable or oral agent n/a NA Ostomy pouch emptied and vented if applicable NA All implantable devices assessed, documented and approved NA Intravenous access site secured and place Valuables secured Linens and cotton and cotton/polyester blend (less than 51% polyester) Personal oil-based products / skin lotions / body lotions removed Wigs or hairpieces removed Smoking or tobacco materials removed Books / newspapers / magazines / loose paper removed Cologne, aftershave, perfume and deodorant removed Jewelry removed (may wrap wedding band) Make-up removed Hair care products removed Battery operated devices (external) removed Heating patches and chemical warmers removed NA Titanium eyewear removed NA Nail polish cured greater than 10 hours NA Casting material cured greater than 10 hours Hearing aids removed Loose dentures or partials removed NA Prosthetics have been removed Patient demonstrates correct use of air break device (if applicable) Patient  concerns have been addressed Patient grounding bracelet on and cord attached to chamber Specifics for Inpatients (complete in addition to above) Medication sheet sent with patient Intravenous medications needed or due during therapy sent with patient ALBERT, TAINTER. ([id number]) Drainage tubes (e.g. nasogastric tube or chest tube secured and vented)  Endotracheal or Tracheotomy tube secured Cuff deflated of air and inflated with saline Airway suctioned Electronic Signature(s) Signed: 06/29/2015 1:25:08 PM By: Lorine Bears RCP, RRT, CHT Entered By: Lorine Bears on 06/29/2015 09:46:43

## 2015-06-30 ENCOUNTER — Encounter: Payer: Medicare Other | Admitting: Surgery

## 2015-06-30 DIAGNOSIS — N3041 Irradiation cystitis with hematuria: Secondary | ICD-10-CM | POA: Diagnosis not present

## 2015-07-01 ENCOUNTER — Encounter: Payer: Medicare Other | Admitting: Surgery

## 2015-07-01 DIAGNOSIS — N3041 Irradiation cystitis with hematuria: Secondary | ICD-10-CM | POA: Diagnosis not present

## 2015-07-01 NOTE — Progress Notes (Signed)
ADDIEL, GLEISSNER ([id number]) Visit Report for 06/30/2015 Arrival Information Details Patient Name: [name], [name] Date of Service: 06/30/2015 10:00 AM Medical Record Number: [id number] Patient Account Number: [id number] Date of Birth/Sex: 27-Feb-1920 (81 y.o. Male) Treating RN: Primary Care Physician: Frazier Richards Other Clinician: Jacqulyn Bath Referring Physician: Frazier Richards Treating Physician/Extender: Frann Rider in Treatment: 9 Visit Information History Since Last Visit Added or deleted any medications: No Patient Arrived: Ambulatory Any new allergies or adverse reactions: No Arrival Time: 09:12 Had a fall or experienced change in No Accompanied By: self activities of daily living that may affect Transfer Assistance: Manual risk of falls: Patient Identification Verified: Yes Signs or symptoms of abuse/neglect since last No Secondary Verification Process Yes visito Completed: Hospitalized since last visit: No Patient Has Alerts: Yes Pain Present Now: No Electronic Signature(s) Signed: 06/30/2015 12:10:42 PM By: Lorine Bears RCP, RRT, CHT Entered By: Lorine Bears on 06/30/2015 09:20:23 [name] ([id number]) -------------------------------------------------------------------------------- Encounter Discharge Information Details Patient Name: [name] Date of Service: 06/30/2015 10:00 AM Medical Record Number: [id number] Patient Account Number: [id number] Date of Birth/Sex: August 21, 1919 (81 y.o. Male) Treating RN: Primary Care Physician: Frazier Richards Other Clinician: Jacqulyn Bath Referring Physician: Frazier Richards Treating Physician/Extender: Frann Rider in Treatment: 9 Encounter Discharge Information Items Discharge Pain Level: 0 Discharge Condition: Stable Ambulatory Status: Ambulatory Discharge Destination: Home Private Transportation: Auto Accompanied By: self Schedule Follow-up  Appointment: No Medication Reconciliation completed and No provided to Patient/Care Rashed Edler: Clinical Summary of Care: Notes Patient has an HBO treatment scheduled on 07/01/15 at 10:00 am. Electronic Signature(s) Signed: 06/30/2015 12:10:42 PM By: Lorine Bears RCP, RRT, CHT Entered By: Becky Sax, Amado Nash on 06/30/2015 11:39:14 Polito, [name] ([id number]) -------------------------------------------------------------------------------- Vitals Details Patient Name: [name] Date of Service: 06/30/2015 10:00 AM Medical Record Number: [id number] Patient Account Number: [id number] Date of Birth/Sex: [birth year] (81 y.o. Male) Treating RN: Primary Care Physician: Frazier Richards Other Clinician: Jacqulyn Bath Referring Physician: Frazier Richards Treating Physician/Extender: Frann Rider in Treatment: 9 Vital Signs Time Taken: 09:12 Temperature (F): 96.3 Height (in): 67 Pulse (bpm): 78 Weight (lbs): 116.8 Respiratory Rate (breaths/min): 18 Body Mass Index (BMI): 18.3 Blood Pressure (mmHg): 120/58 Reference Range: 80 - 120 mg / dl Electronic Signature(s) Signed: 06/30/2015 12:10:42 PM By: Lorine Bears RCP, RRT, CHT Entered By: Becky Sax, Amado Nash on 06/30/2015 09:20:42

## 2015-07-01 NOTE — Progress Notes (Signed)
RINO, TWILLEY ([id number]) Visit Report for 06/30/2015 HBO Details Patient Name: [name], [name]. Date of Service: 06/30/2015 10:00 AM Medical Record Number: [id number] Patient Account Number: [id number] Date of Birth/Sex: Jan 29, 1920 (81 y.o. Male) Treating RN: Primary Care Physician: Frazier Richards Other Clinician: Jacqulyn Bath Referring Physician: Frazier Richards Treating Physician/Extender: Frann Rider in Treatment: 9 HBO Treatment Course Details Treatment Course Ordering Physician: Christin Fudge 1 Number: HBO Treatment Start Date: 05/05/2015 Total Treatments 40 Ordered: HBO Indication: Late Effect of Radiation HBO Treatment Details Treatment Number: 36 Patient Type: Outpatient Chamber Type: Monoplace Chamber #: HBO [id number] Treatment Protocol: 2.0 ATA with 90 minutes oxygen, and no air breaks Treatment Details Compression Rate Down: 1.5 psi / minute De-Compression Rate Up: 1.5 psi / minute Air breaks and breathing Compress Tx Pressure Decompress Decompress periods Begins Reached Begins Ends (leave unused spaces blank) Chamber Pressure 1 ATA 2.0 ATA - - - - - - 2.0 ATA 1 ATA Clock Time (24 hr) 09:31 09:41 - - - - - - 11:11 11:22 Treatment Length: 111 (minutes) Treatment Segments: 4 Capillary Blood Glucose Pre Capillary Blood Glucose (mg/dl): Post Capillary Blood Glucose (mg/dl): Vital Signs Capillary Blood Glucose Reference Range: 80 - 120 mg / dl HBO Diabetic Blood Glucose Intervention Range: <131 mg/dl or >249 mg/dl Time Vitals Blood Respiratory Capillary Blood Glucose Pulse Action Type: Pulse: Temperature: Taken: Pressure: Rate: Glucose (mg/dl): Meter #: Oximetry (%) Taken: Pre 09:12 120/58 78 18 96.3 Post 11:25 122/66 48 18 98.3 Treatment Response Treatment Completion Status: Treatment Completed without Adverse Event [name], [name] ([id number]) HBO Attestation I certify that I supervised this HBO treatment in accordance with Medicare  guidelines. A trained Yes emergency response team is readily available per hospital policies and procedures. Continue HBOT as ordered. Yes Electronic Signature(s) Signed: 06/30/2015 12:05:36 PM By: Christin Fudge MD, FACS Entered By: Christin Fudge on 06/30/2015 12:05:36 [name], [name] ([id number]) -------------------------------------------------------------------------------- HBO Safety Checklist Details Patient Name: [name] Date of Service: 06/30/2015 10:00 AM Medical Record Number: [id number] Patient Account Number: [id number] Date of Birth/Sex: January 30, 1920 (81 y.o. Male) Treating RN: Primary Care Physician: Frazier Richards Other Clinician: Jacqulyn Bath Referring Physician: Frazier Richards Treating Physician/Extender: Frann Rider in Treatment: 9 HBO Safety Checklist Items Safety Checklist Consent Form Signed Patient voided / foley secured and emptied When did you last eato 07:00 am Last dose of injectable or oral agent n/a NA Ostomy pouch emptied and vented if applicable NA All implantable devices assessed, documented and approved NA Intravenous access site secured and place Valuables secured Linens and cotton and cotton/polyester blend (less than 51% polyester) Personal oil-based products / skin lotions / body lotions removed Wigs or hairpieces removed Smoking or tobacco materials removed Books / newspapers / magazines / loose paper removed Cologne, aftershave, perfume and deodorant removed Jewelry removed (may wrap wedding band) Make-up removed Hair care products removed Battery operated devices (external) removed Heating patches and chemical warmers removed NA Titanium eyewear removed NA Nail polish cured greater than 10 hours NA Casting material cured greater than 10 hours Hearing aids removed Loose dentures or partials removed NA Prosthetics have been removed Patient demonstrates correct use of air break device (if applicable) Patient  concerns have been addressed Patient grounding bracelet on and cord attached to chamber Specifics for Inpatients (complete in addition to above) Medication sheet sent with patient Intravenous medications needed or due during therapy sent with patient [name], [name]. ([id number]) Drainage tubes (e.g. nasogastric tube or chest tube secured and vented)  Endotracheal or Tracheotomy tube secured Cuff deflated of air and inflated with saline Airway suctioned Electronic Signature(s) Signed: 06/30/2015 12:10:42 PM By: Lorine Bears RCP, RRT, CHT Entered By: Becky Sax, Amado Nash on 06/30/2015 [id number]

## 2015-07-02 NOTE — Progress Notes (Signed)
JIVAN, YARMAN ([id number]) Visit Report for 07/01/2015 Arrival Information Details Patient Name: KIYEN, BARRACO Date of Service: 07/01/2015 10:00 AM Medical Record Number: [id number] Patient Account Number: [id number] Date of Birth/Sex: 08/02/1920 (81 y.o. Male) Treating RN: Primary Care Physician: Frazier Richards Other Clinician: Jacqulyn Bath Referring Physician: Frazier Richards Treating Physician/Extender: BURNS III, Charlean Sanfilippo in Treatment: 9 Visit Information History Since Last Visit Added or deleted any medications: No Patient Arrived: Ambulatory Any new allergies or adverse reactions: No Arrival Time: 09:18 Had a fall or experienced change in No Accompanied By: self activities of daily living that may affect Transfer Assistance: Manual risk of falls: Patient Identification Verified: Yes Signs or symptoms of abuse/neglect since last No Secondary Verification Process Yes visito Completed: Hospitalized since last visit: No Patient Has Alerts: Yes Pain Present Now: No Electronic Signature(s) Signed: 07/01/2015 12:55:22 PM By: Lorine Bears RCP, RRT, CHT Entered By: Becky Sax, Amado Nash on 07/01/2015 09:31:33 Rueger, [name] ([id number]) -------------------------------------------------------------------------------- Encounter Discharge Information Details Patient Name: [name] Date of Service: 07/01/2015 10:00 AM Medical Record Number: [id number] Patient Account Number: [id number] Date of Birth/Sex: May 13, 1920 (81 y.o. Male) Treating RN: Primary Care Physician: Frazier Richards Other Clinician: Jacqulyn Bath Referring Physician: Frazier Richards Treating Physician/Extender: BURNS III, Charlean Sanfilippo in Treatment: 9 Encounter Discharge Information Items Discharge Pain Level: 0 Discharge Condition: Stable Ambulatory Status: Ambulatory Discharge Destination: Home Private Transportation: Auto Accompanied By: self Schedule  Follow-up Appointment: No Medication Reconciliation completed and No provided to Patient/Care Garyson Stelly: Clinical Summary of Care: Notes Patient has an HBO treatment scheduled on 07/06/15 at 10:00 am. Electronic Signature(s) Signed: 07/01/2015 12:55:22 PM By: Lorine Bears RCP, RRT, CHT Entered By: Becky Sax, Amado Nash on 07/01/2015 12:55:07 Enerson, [name] ([id number]) -------------------------------------------------------------------------------- Vitals Details Patient Name: [name] Date of Service: 07/01/2015 10:00 AM Medical Record Number: [id number] Patient Account Number: [id number] Date of Birth/Sex: March 31, 1920 (81 y.o. Male) Treating RN: Primary Care Physician: Frazier Richards Other Clinician: Jacqulyn Bath Referring Physician: Frazier Richards Treating Physician/Extender: BURNS III, Charlean Sanfilippo in Treatment: 9 Vital Signs Time Taken: 09:18 Temperature (F): 96.8 Height (in): 67 Pulse (bpm): 54 Weight (lbs): 116.8 Respiratory Rate (breaths/min): 18 Body Mass Index (BMI): 18.3 Blood Pressure (mmHg): 134/70 Reference Range: 80 - 120 mg / dl Electronic Signature(s) Signed: 07/01/2015 12:55:22 PM By: Lorine Bears RCP, RRT, CHT Entered By: Lorine Bears on 07/01/2015 09:31:54

## 2015-07-02 NOTE — Progress Notes (Signed)
CLINT, GRONEMEYER ([id number]) Visit Report for 07/01/2015 HBO Details Patient Name: [name], [name]. Date of Service: 07/01/2015 10:00 AM Medical Record Number: [id number] Patient Account Number: [id number] Date of Birth/Sex: [birth year] (81 y.o. Male) Treating RN: Primary Care Physician: Frazier Richards Other Clinician: Jacqulyn Bath Referring Physician: Frazier Richards Treating Physician/Extender: BURNS III, Charlean Sanfilippo in Treatment: 9 HBO Treatment Course Details Treatment Course Ordering Physician: Christin Fudge 1 Number: HBO Treatment Start Date: 05/05/2015 Total Treatments 40 Ordered: HBO Indication: Late Effect of Radiation HBO Treatment Details Treatment Number: 37 Patient Type: Outpatient Chamber Type: Monoplace Chamber #: HBO [id number] Treatment Protocol: 2.0 ATA with 90 minutes oxygen, and no air breaks Treatment Details Compression Rate Down: 1.5 psi / minute De-Compression Rate Up: 1.5 psi / minute Air breaks and breathing Compress Tx Pressure Decompress Decompress periods Begins Reached Begins Ends (leave unused spaces blank) Chamber Pressure 1 ATA 2.0 ATA - - - - - - 2.0 ATA 1 ATA Clock Time (24 hr) 09:39 09:49 - - - - - - 11:19 11:30 Treatment Length: 111 (minutes) Treatment Segments: 4 Capillary Blood Glucose Pre Capillary Blood Glucose (mg/dl): Post Capillary Blood Glucose (mg/dl): Vital Signs Capillary Blood Glucose Reference Range: 80 - 120 mg / dl HBO Diabetic Blood Glucose Intervention Range: <131 mg/dl or >249 mg/dl Time Vitals Blood Respiratory Capillary Blood Glucose Pulse Action Type: Pulse: Temperature: Taken: Pressure: Rate: Glucose (mg/dl): Meter #: Oximetry (%) Taken: Pre 09:18 134/70 54 18 96.8 Post 11:35 112/68 48 18 96.6 Treatment Response Treatment Completion Status: Treatment Completed without Adverse Event ANDRAE, KLEBE ([id number]) Electronic Signature(s) Signed: 07/01/2015 12:55:22 PM By: Lorine Bears  RCP, RRT, CHT Signed: 07/01/2015 3:24:44 PM By: Loletha Grayer MD Entered By: Lorine Bears on 07/01/2015 12:54:27 Scherzer, Jago W. ([id number]) -------------------------------------------------------------------------------- HBO Safety Checklist Details Patient Name: [name] Date of Service: 07/01/2015 10:00 AM Medical Record Number: [id number] Patient Account Number: [id number] Date of Birth/Sex: 04/10/1920 (81 y.o. Male) Treating RN: Primary Care Physician: Frazier Richards Other Clinician: Jacqulyn Bath Referring Physician: Frazier Richards Treating Physician/Extender: BURNS III, Charlean Sanfilippo in Treatment: 9 HBO Safety Checklist Items Safety Checklist Consent Form Signed Patient voided / foley secured and emptied When did you last eato 07:00 am Last dose of injectable or oral agent n/a NA Ostomy pouch emptied and vented if applicable NA All implantable devices assessed, documented and approved NA Intravenous access site secured and place Valuables secured Linens and cotton and cotton/polyester blend (less than 51% polyester) Personal oil-based products / skin lotions / body lotions removed Wigs or hairpieces removed Smoking or tobacco materials removed Books / newspapers / magazines / loose paper removed Cologne, aftershave, perfume and deodorant removed Jewelry removed (may wrap wedding band) Make-up removed Hair care products removed Battery operated devices (external) removed Heating patches and chemical warmers removed NA Titanium eyewear removed NA Nail polish cured greater than 10 hours NA Casting material cured greater than 10 hours Hearing aids removed Loose dentures or partials removed NA Prosthetics have been removed Patient demonstrates correct use of air break device (if applicable) Patient concerns have been addressed Patient grounding bracelet on and cord attached to chamber Specifics for Inpatients (complete in addition to  above) Medication sheet sent with patient Intravenous medications needed or due during therapy sent with patient VARTAN, HAUSSER. ([id number]) Drainage tubes (e.g. nasogastric tube or chest tube secured and vented) Endotracheal or Tracheotomy tube secured Cuff deflated of air and inflated with saline Airway suctioned Electronic Signature(s) Signed: 07/01/2015 12:55:22  PM By: Lorine Bears RCP, RRT, CHT Entered By: Lorine Bears on 07/01/2015 09:32:42

## 2015-07-06 ENCOUNTER — Encounter: Payer: Medicare Other | Admitting: Surgery

## 2015-07-06 DIAGNOSIS — N3041 Irradiation cystitis with hematuria: Secondary | ICD-10-CM | POA: Diagnosis not present

## 2015-07-06 NOTE — Progress Notes (Signed)
[name], [name] ([id number]) Visit Report for 07/06/2015 HBO Details Patient Name: [name], [name]. Date of Service: 07/06/2015 10:00 AM Medical Record Number: [id number] Patient Account Number: [id number] Date of Birth/Sex: Aug 16, 1919 (81 y.o. Male) Treating RN: Primary Care Physician: Frazier Richards Other Clinician: Jacqulyn Bath Referring Physician: Frazier Richards Treating Physician/Extender: Frann Rider in Treatment: 10 HBO Treatment Course Details Treatment Course Ordering Physician: Christin Fudge 1 Number: HBO Treatment Start Date: 05/05/2015 Total Treatments 40 Ordered: HBO Indication: Late Effect of Radiation HBO Treatment Details Treatment Number: 38 Patient Type: Outpatient Chamber Type: Monoplace Chamber #: HBO [id number] Treatment Protocol: 2.0 ATA with 90 minutes oxygen, and no air breaks Treatment Details Compression Rate Down: 1.5 psi / minute De-Compression Rate Up: 1.5 psi / minute Air breaks and breathing Compress Tx Pressure Decompress Decompress periods Begins Reached Begins Ends (leave unused spaces blank) Chamber Pressure 1 ATA 2.0 ATA - - - - - - 2.0 ATA 1 ATA Clock Time (24 hr) 09:38 09:49 - - - - - - 11:19 11:29 Treatment Length: 111 (minutes) Treatment Segments: 4 Capillary Blood Glucose Pre Capillary Blood Glucose (mg/dl): Post Capillary Blood Glucose (mg/dl): Vital Signs Capillary Blood Glucose Reference Range: 80 - 120 mg / dl HBO Diabetic Blood Glucose Intervention Range: <131 mg/dl or >249 mg/dl Time Vitals Blood Respiratory Capillary Blood Glucose Pulse Action Type: Pulse: Temperature: Taken: Pressure: Rate: Glucose (mg/dl): Meter #: Oximetry (%) Taken: Pre 09:20 120/76 60 18 96.1 Post 11:30 122/68 54 18 98.1 Treatment Response Treatment Completion Status: Treatment Completed without Adverse Event [name], [name] ([id number]) HBO Attestation I certify that I supervised this HBO treatment in accordance with Medicare  guidelines. A trained Yes emergency response team is readily available per hospital policies and procedures. Continue HBOT as ordered. Yes Electronic Signature(s) Signed: 07/06/2015 12:47:31 PM By: Christin Fudge MD, FACS Previous Signature: 07/06/2015 12:10:27 PM Version By: Lorine Bears RCP, RRT, CHT Entered By: Christin Fudge on 07/06/2015 12:47:31 [name], [name] ([id number]) -------------------------------------------------------------------------------- HBO Safety Checklist Details Patient Name: [name] Date of Service: 07/06/2015 10:00 AM Medical Record Number: [id number] Patient Account Number: [id number] Date of Birth/Sex: 08/09/1919 (81 y.o. Male) Treating RN: Primary Care Physician: Frazier Richards Other Clinician: Jacqulyn Bath Referring Physician: Frazier Richards Treating Physician/Extender: Frann Rider in Treatment: 10 HBO Safety Checklist Items Safety Checklist Consent Form Signed Patient voided / foley secured and emptied When did you last eato 07:00 am Last dose of injectable or oral agent n/a NA Ostomy pouch emptied and vented if applicable NA All implantable devices assessed, documented and approved NA Intravenous access site secured and place Valuables secured Linens and cotton and cotton/polyester blend (less than 51% polyester) Personal oil-based products / skin lotions / body lotions removed Wigs or hairpieces removed Smoking or tobacco materials removed Books / newspapers / magazines / loose paper removed Cologne, aftershave, perfume and deodorant removed Jewelry removed (may wrap wedding band) Make-up removed Hair care products removed Battery operated devices (external) removed Heating patches and chemical warmers removed NA Titanium eyewear removed NA Nail polish cured greater than 10 hours NA Casting material cured greater than 10 hours Hearing aids removed Loose dentures or partials removed NA Prosthetics  have been removed Patient demonstrates correct use of air break device (if applicable) Patient concerns have been addressed Patient grounding bracelet on and cord attached to chamber Specifics for Inpatients (complete in addition to above) Medication sheet sent with patient Intravenous medications needed or due during therapy sent with patient [name], [name]  W. ([id number]) Drainage tubes (e.g. nasogastric tube or chest tube secured and vented) Endotracheal or Tracheotomy tube secured Cuff deflated of air and inflated with saline Airway suctioned Electronic Signature(s) Signed: 07/06/2015 12:10:27 PM By: Lorine Bears RCP, RRT, CHT Entered By: Lorine Bears on 07/06/2015 09:43:45

## 2015-07-06 NOTE — Progress Notes (Signed)
MURRIEL, BECKEN ([id number]) Visit Report for 07/06/2015 Arrival Information Details Patient Name: NISHAL, STRENGER Date of Service: 07/06/2015 10:00 AM Medical Record Number: [id number] Patient Account Number: [id number] Date of Birth/Sex: 1919/10/30 (81 y.o. Male) Treating RN: Primary Care Physician: Frazier Richards Other Clinician: Jacqulyn Bath Referring Physician: Frazier Richards Treating Physician/Extender: Frann Rider in Treatment: 10 Visit Information History Since Last Visit Added or deleted any medications: No Patient Arrived: Ambulatory Any new allergies or adverse reactions: No Arrival Time: 09:20 Had a fall or experienced change in No Accompanied By: wife activities of daily living that may affect Transfer Assistance: Manual risk of falls: Patient Identification Verified: Yes Hospitalized since last visit: No Secondary Verification Process Yes Pain Present Now: No Completed: Patient Has Alerts: Yes Electronic Signature(s) Signed: 07/06/2015 12:10:27 PM By: Lorine Bears RCP, RRT, CHT Entered By: Becky Sax, Amado Nash on 07/06/2015 09:42:36 Abed, [name] ([id number]) -------------------------------------------------------------------------------- Encounter Discharge Information Details Patient Name: [name] Date of Service: 07/06/2015 10:00 AM Medical Record Number: [id number] Patient Account Number: [id number] Date of Birth/Sex: [birth year] (81 y.o. Male) Treating RN: Primary Care Physician: Frazier Richards Other Clinician: Jacqulyn Bath Referring Physician: Frazier Richards Treating Physician/Extender: Frann Rider in Treatment: 10 Encounter Discharge Information Items Discharge Pain Level: 0 Discharge Condition: Stable Ambulatory Status: Ambulatory Discharge Destination: Home Private Transportation: Auto Accompanied By: wife Schedule Follow-up Appointment: No Medication Reconciliation completed  and No provided to Patient/Care Doreather Hoxworth: Clinical Summary of Care: Notes Patient has an HBO treatment scheduled on 07/07/15 at 10:00 am. Electronic Signature(s) Signed: 07/06/2015 12:10:27 PM By: Lorine Bears RCP, RRT, CHT Entered By: Becky Sax, Amado Nash on 07/06/2015 11:54:22 Singleton, [name] ([id number]) -------------------------------------------------------------------------------- Vitals Details Patient Name: [name] Date of Service: 07/06/2015 10:00 AM Medical Record Number: [id number] Patient Account Number: [id number] Date of Birth/Sex: 12-05-1919 (81 y.o. Male) Treating RN: Primary Care Physician: Frazier Richards Other Clinician: Jacqulyn Bath Referring Physician: Frazier Richards Treating Physician/Extender: Frann Rider in Treatment: 10 Vital Signs Time Taken: 09:20 Temperature (F): 96.1 Height (in): 67 Pulse (bpm): 60 Weight (lbs): 116.8 Respiratory Rate (breaths/min): 18 Body Mass Index (BMI): 18.3 Blood Pressure (mmHg): 120/76 Reference Range: 80 - 120 mg / dl Electronic Signature(s) Signed: 07/06/2015 12:10:27 PM By: Lorine Bears RCP, RRT, CHT Entered By: Lorine Bears on 07/06/2015 09:43:12

## 2015-07-07 ENCOUNTER — Encounter: Payer: Medicare Other | Admitting: Surgery

## 2015-07-07 DIAGNOSIS — N3041 Irradiation cystitis with hematuria: Secondary | ICD-10-CM | POA: Diagnosis not present

## 2015-07-07 NOTE — Progress Notes (Signed)
[name], [name] ([id number]) Visit Report for 07/07/2015 Arrival Information Details Patient Name: [name], [name] Date of Service: 07/07/2015 10:00 AM Medical Record Number: [id number] Patient Account Number: [id number] Date of Birth/Sex: 1919-11-09 (81 y.o. Male) Treating RN: Primary Care Physician: Frazier Richards Other Clinician: Jacqulyn Bath Referring Physician: Frazier Richards Treating Physician/Extender: Frann Rider in Treatment: 10 Visit Information History Since Last Visit Added or deleted any medications: No Patient Arrived: Ambulatory Any new allergies or adverse reactions: No Arrival Time: 09:30 Had a fall or experienced change in No Accompanied By: wife activities of daily living that may affect Transfer Assistance: Manual risk of falls: Patient Identification Verified: Yes Signs or symptoms of abuse/neglect since last No Secondary Verification Process Yes visito Completed: Hospitalized since last visit: No Patient Has Alerts: Yes Pain Present Now: No Electronic Signature(s) Signed: 07/07/2015 1:50:53 PM By: Lorine Bears RCP, RRT, CHT Entered By: Becky Sax, Amado Nash on 07/07/2015 09:35:41 [name], [name] ([id number]) -------------------------------------------------------------------------------- Encounter Discharge Information Details Patient Name: [name] Date of Service: 07/07/2015 10:00 AM Medical Record Number: [id number] Patient Account Number: [id number] Date of Birth/Sex: 1919-12-15 (81 y.o. Male) Treating RN: Primary Care Physician: Frazier Richards Other Clinician: Jacqulyn Bath Referring Physician: Frazier Richards Treating Physician/Extender: Frann Rider in Treatment: 10 Encounter Discharge Information Items Discharge Pain Level: 0 Discharge Condition: Stable Ambulatory Status: Ambulatory Discharge Destination: Home Private Transportation: Auto Accompanied By: wife Schedule Follow-up  Appointment: No Medication Reconciliation completed and No provided to Patient/Care [name]: Clinical Summary of Care: Notes Patient has his 40/40 HBO treatment scheduled on 07/08/15 at 10:00 am. Electronic Signature(s) Signed: 07/07/2015 1:50:53 PM By: Lorine Bears RCP, RRT, CHT Entered By: Lorine Bears on 07/07/2015 13:02:18 [name], [name] ([id number]) -------------------------------------------------------------------------------- Vitals Details Patient Name: [name] Date of Service: 07/07/2015 10:00 AM Medical Record Number: [id number] Patient Account Number: [id number] Date of Birth/Sex: Jun 21, 1920 (81 y.o. Male) Treating RN: Primary Care Physician: Frazier Richards Other Clinician: Jacqulyn Bath Referring Physician: Frazier Richards Treating Physician/Extender: Frann Rider in Treatment: 10 Vital Signs Time Taken: 09:30 Temperature (F): 97.4 Height (in): 67 Pulse (bpm): 66 Weight (lbs): 116.8 Respiratory Rate (breaths/min): 18 Body Mass Index (BMI): 18.3 Blood Pressure (mmHg): 118/72 Reference Range: 80 - 120 mg / dl Electronic Signature(s) Signed: 07/07/2015 1:50:53 PM By: Lorine Bears RCP, RRT, CHT Entered By: Lorine Bears on 07/07/2015 09:36:04

## 2015-07-08 ENCOUNTER — Encounter: Payer: Medicare Other | Admitting: Surgery

## 2015-07-08 DIAGNOSIS — N3041 Irradiation cystitis with hematuria: Secondary | ICD-10-CM | POA: Diagnosis not present

## 2015-07-08 NOTE — Progress Notes (Signed)
GOTTI, KIRSCH ([id number]) Visit Report for 07/07/2015 HBO Details Patient Name: [name], [name]. Date of Service: 07/07/2015 10:00 AM Medical Record Number: [id number] Patient Account Number: [id number] Date of Birth/Sex: [birth year] (81 y.o. Male) Treating RN: Primary Care Physician: Frazier Richards Other Clinician: Jacqulyn Bath Referring Physician: Frazier Richards Treating Physician/Extender: Frann Rider in Treatment: 10 HBO Treatment Course Details Treatment Course Ordering Physician: Christin Fudge 1 Number: HBO Treatment Start Date: 05/05/2015 Total Treatments 40 Ordered: HBO Indication: Late Effect of Radiation HBO Treatment Details Treatment Number: 39 Patient Type: Outpatient Chamber Type: Monoplace Chamber #: HBO [id number] Treatment Protocol: 2.0 ATA with 90 minutes oxygen, and no air breaks Treatment Details Compression Rate Down: 1.5 psi / minute De-Compression Rate Up: 1.5 psi / minute Air breaks and breathing Compress Tx Pressure Decompress Decompress periods Begins Reached Begins Ends (leave unused spaces blank) Chamber Pressure 1 ATA 2.0 ATA - - - - - - 2.0 ATA 1 ATA Clock Time (24 hr) 09:48 09:58 - - - - - - 11:28 11:38 Treatment Length: 110 (minutes) Treatment Segments: 4 Capillary Blood Glucose Pre Capillary Blood Glucose (mg/dl): Post Capillary Blood Glucose (mg/dl): Vital Signs Capillary Blood Glucose Reference Range: 80 - 120 mg / dl HBO Diabetic Blood Glucose Intervention Range: <131 mg/dl or >249 mg/dl Time Vitals Blood Respiratory Capillary Blood Glucose Pulse Action Type: Pulse: Temperature: Taken: Pressure: Rate: Glucose (mg/dl): Meter #: Oximetry (%) Taken: Pre 09:30 118/72 66 18 97.4 Post 11:44 122/70 48 18 97.9 Treatment Response Treatment Completion Status: Treatment Completed without Adverse Event [name], [name] ([id number]) Electronic Signature(s) Signed: 07/07/2015 1:50:53 PM By: Lorine Bears RCP,  RRT, CHT Signed: 07/07/2015 5:47:05 PM By: Christin Fudge MD, FACS Entered By: Becky Sax, Amado Nash on 07/07/2015 11:48:06 [name], [place]. ([id number]) -------------------------------------------------------------------------------- HBO Safety Checklist Details Patient Name: [name] Date of Service: 07/07/2015 10:00 AM Medical Record Number: [id number] Patient Account Number: [id number] Date of Birth/Sex: [birth year] (81 y.o. Male) Treating RN: Primary Care Physician: Frazier Richards Other Clinician: Jacqulyn Bath Referring Physician: Frazier Richards Treating Physician/Extender: Frann Rider in Treatment: 10 HBO Safety Checklist Items Safety Checklist Consent Form Signed Patient voided / foley secured and emptied When did you last eato 07:00 am Last dose of injectable or oral agent n/a NA Ostomy pouch emptied and vented if applicable NA All implantable devices assessed, documented and approved NA Intravenous access site secured and place Valuables secured Linens and cotton and cotton/polyester blend (less than 51% polyester) Personal oil-based products / skin lotions / body lotions removed Wigs or hairpieces removed Smoking or tobacco materials removed Books / newspapers / magazines / loose paper removed Cologne, aftershave, perfume and deodorant removed Jewelry removed (may wrap wedding band) Make-up removed Hair care products removed Battery operated devices (external) removed Heating patches and chemical warmers removed NA Titanium eyewear removed NA Nail polish cured greater than 10 hours NA Casting material cured greater than 10 hours Hearing aids removed Loose dentures or partials removed NA Prosthetics have been removed Patient demonstrates correct use of air break device (if applicable) Patient concerns have been addressed Patient grounding bracelet on and cord attached to chamber Specifics for Inpatients (complete in addition to  above) Medication sheet sent with patient Intravenous medications needed or due during therapy sent with patient [name], ACHILLE. ([id number]) Drainage tubes (e.g. nasogastric tube or chest tube secured and vented) Endotracheal or Tracheotomy tube secured Cuff deflated of air and inflated with saline Airway suctioned Electronic Signature(s) Signed: 07/07/2015 1:50:53 PM By:  Becky Sax, Sallie RCP, RRT, CHT Entered By: Lorine Bears on 07/07/2015 09:36:52

## 2015-07-09 ENCOUNTER — Encounter: Payer: Medicare Other | Admitting: Surgery

## 2015-07-10 NOTE — Progress Notes (Signed)
COULTER, GALLER ([id number]) Visit Report for 07/08/2015 HBO Details Patient Name: [name], [name] Date of Service: 07/08/2015 10:00 AM Medical Record Number: [id number] Patient Account Number: [id number] Date of Birth/Sex: 10/29/1919 (81 y.o. Male) Treating RN: Primary Care Physician: Frazier Richards Other Clinician: Jacqulyn Bath Referring Physician: Frazier Richards Treating Physician/Extender: BURNS III, Charlean Sanfilippo in Treatment: 10 HBO Treatment Course Details Treatment Course Ordering 1 Christin Fudge Number: Physician: Total Treatments HBO Treatment 40 05/05/2015 Ordered: Start Date: HBO Indication: HBO Treatment 07/08/2015 Late Effect of Radiation End Date: Treatment Series Complete; HBO Discharge STRN/ORN Protocol Goals Outcome: Achieved HBO Treatment Details Treatment Number: 40 Patient Type: Outpatient Chamber Type: Monoplace Chamber #: HBO [id number] Treatment Protocol: 2.0 ATA with 90 minutes oxygen, and no air breaks Treatment Details Compression Rate Down: 1.5 psi / minute De-Compression Rate Up: 1.5 psi / minute Air breaks and breathing Compress Tx Pressure Decompress Decompress periods Begins Reached Begins Ends (leave unused spaces blank) Chamber Pressure 1 ATA 2.0 ATA - - - - - - 2.0 ATA 1 ATA Clock Time (24 hr) 09:54 10:04 - - - - - - 11:34 11:45 Treatment Length: 111 (minutes) Treatment Segments: 4 Capillary Blood Glucose Pre Capillary Blood Glucose (mg/dl): Post Capillary Blood Glucose (mg/dl): Vital Signs Capillary Blood Glucose Reference Range: 80 - 120 mg / dl HBO Diabetic Blood Glucose Intervention Range: <131 mg/dl or >249 mg/dl Time Vitals Blood Respiratory Capillary Blood Glucose Pulse Action Type: Pulse: Temperature: Taken: Pressure: Rate: Glucose (mg/dl): Meter #: Oximetry (%) Taken: Pre 09:33 114/72 60 18 97.6 Post 11:48 122/68 48 18 98.6 Majchrzak, Azari W. ([id number]) Treatment  Response Treatment Well Toleration: Treatment Treatment Completed without Adverse Event Completion Status: HBO Attestation I certify that I supervised this HBO treatment in accordance with Medicare guidelines. A trained Yes emergency response team is readily available per hospital policies and procedures. Continue HBOT as ordered. Yes Electronic Signature(s) Signed: 07/08/2015 4:02:10 PM By: Loletha Grayer MD Entered By: Loletha Grayer on 07/08/2015 13:01:16 Mcmackin, Draco W. ([id number]) -------------------------------------------------------------------------------- HBO Safety Checklist Details Patient Name: [name] Date of Service: 07/08/2015 10:00 AM Medical Record Number: [id number] Patient Account Number: [id number] Date of Birth/Sex: 02-02-1920 (81 y.o. Male) Treating RN: Primary Care Physician: Frazier Richards Other Clinician: Jacqulyn Bath Referring Physician: Frazier Richards Treating Physician/Extender: BURNS III, Charlean Sanfilippo in Treatment: 10 HBO Safety Checklist Items Safety Checklist Consent Form Signed Patient voided / foley secured and emptied When did you last eato 07:00 am Last dose of injectable or oral agent n/a NA Ostomy pouch emptied and vented if applicable NA All implantable devices assessed, documented and approved NA Intravenous access site secured and place Valuables secured Linens and cotton and cotton/polyester blend (less than 51% polyester) Personal oil-based products / skin lotions / body lotions removed Wigs or hairpieces removed Smoking or tobacco materials removed Books / newspapers / magazines / loose paper removed Cologne, aftershave, perfume and deodorant removed Jewelry removed (may wrap wedding band) Make-up removed Hair care products removed Battery operated devices (external) removed Heating patches and chemical warmers removed NA Titanium eyewear removed NA Nail polish cured greater than 10 hours NA  Casting material cured greater than 10 hours Hearing aids removed Loose dentures or partials removed NA Prosthetics have been removed Patient demonstrates correct use of air break device (if applicable) Patient concerns have been addressed Patient grounding bracelet on and cord attached to chamber Specifics for Inpatients (complete in addition to above) Medication sheet sent with patient Intravenous medications needed or  due during therapy sent with patient DAIVION, MANIVONG. ([id number]) Drainage tubes (e.g. nasogastric tube or chest tube secured and vented) Endotracheal or Tracheotomy tube secured Cuff deflated of air and inflated with saline Airway suctioned Electronic Signature(s) Signed: 07/09/2015 11:03:50 AM By: Lorine Bears RCP, RRT, CHT Entered By: Lorine Bears on 07/08/2015 09:57:03

## 2015-07-10 NOTE — Progress Notes (Signed)
[name] ([id number]) Visit Report for 07/08/2015 Arrival Information Details Patient Name: [name] Date of Service: 07/08/2015 10:00 AM Medical Record Number: [id number] Patient Account Number: [id number] Date of Birth/Sex: 08-Nov-1919 (81 y.o. Male) Treating RN: Primary Care Physician: Frazier Richards Other Clinician: Jacqulyn Bath Referring Physician: Frazier Richards Treating Physician/Extender: BURNS III, Charlean Sanfilippo in Treatment: 10 Visit Information History Since Last Visit Added or deleted any medications: No Patient Arrived: Ambulatory Any new allergies or adverse reactions: No Arrival Time: 09:33 Had a fall or experienced change in No Accompanied By: wife activities of daily living that may affect Transfer Assistance: Manual risk of falls: Patient Identification Verified: Yes Signs or symptoms of abuse/neglect since last No Secondary Verification Process Yes visito Completed: Hospitalized since last visit: No Patient Has Alerts: Yes Pain Present Now: No Electronic Signature(s) Signed: 07/09/2015 11:03:50 AM By: Lorine Bears RCP, RRT, CHT Entered By: Becky Sax, Amado Nash on 07/08/2015 09:47:31 Turay, Darlene W. ([id number]) -------------------------------------------------------------------------------- Encounter Discharge Information Details Patient Name: [name] Date of Service: 07/08/2015 10:00 AM Medical Record Number: [id number] Patient Account Number: [id number] Date of Birth/Sex: May 26, 1920 (81 y.o. Male) Treating RN: Primary Care Physician: Frazier Richards Other Clinician: Jacqulyn Bath Referring Physician: Frazier Richards Treating Physician/Extender: BURNS III, Charlean Sanfilippo in Treatment: 10 Encounter Discharge Information Items Discharge Pain Level: 0 Discharge Condition: Stable Ambulatory Status: Ambulatory Discharge Destination: Home Private Transportation: Auto Accompanied By: wife Schedule  Follow-up Appointment: No Medication Reconciliation completed and No provided to Patient/Care [name]: Clinical Summary of Care: Notes Patient has completed his 40 prescribed HBO treatments. Electronic Signature(s) Signed: 07/09/2015 11:03:50 AM By: Lorine Bears RCP, RRT, CHT Entered By: Lorine Bears on 07/08/2015 12:13:01 [name] ([id number]) -------------------------------------------------------------------------------- Vitals Details Patient Name: [name] Date of Service: 07/08/2015 10:00 AM Medical Record Number: [id number] Patient Account Number: [id number] Date of Birth/Sex: 1920/06/28 (81 y.o. Male) Treating RN: Primary Care Physician: Frazier Richards Other Clinician: Jacqulyn Bath Referring Physician: Frazier Richards Treating Physician/Extender: BURNS III, Charlean Sanfilippo in Treatment: 10 Vital Signs Time Taken: 09:33 Temperature (F): 97.6 Height (in): 67 Pulse (bpm): 60 Weight (lbs): 116.8 Respiratory Rate (breaths/min): 18 Body Mass Index (BMI): 18.3 Blood Pressure (mmHg): 114/72 Reference Range: 80 - 120 mg / dl Electronic Signature(s) Signed: 07/09/2015 11:03:50 AM By: Lorine Bears RCP, RRT, CHT Entered By: Lorine Bears on 07/08/2015 09:56:08

## 2015-08-19 ENCOUNTER — Other Ambulatory Visit: Payer: Self-pay | Admitting: *Deleted

## 2015-08-19 ENCOUNTER — Encounter: Payer: Self-pay | Admitting: Radiation Oncology

## 2015-08-19 ENCOUNTER — Ambulatory Visit
Admission: RE | Admit: 2015-08-19 | Discharge: 2015-08-19 | Disposition: A | Discharge status: Home / Self Care | Payer: Medicare Other | Source: Ambulatory Visit | Attending: Radiation Oncology | Admitting: Radiation Oncology

## 2015-08-19 ENCOUNTER — Inpatient Hospital Stay: Payer: Medicare Other | Attending: Radiation Oncology

## 2015-08-19 VITALS — BP 135/69 | HR 51 | Temp 97.1°F | Wt 123.5 lb

## 2015-08-19 DIAGNOSIS — C61 Malignant neoplasm of prostate: Secondary | ICD-10-CM | POA: Insufficient documentation

## 2015-08-19 LAB — PSA: PSA: 0.42 ng/mL (ref 0.00–4.00)

## 2015-08-19 NOTE — Progress Notes (Signed)
Radiation Oncology Follow up Note  Name: [name]   Date:   08/19/2015 MRN:  [id number] DOB: 08/08/1920    This 82 y.o. male presents to the clinic today for follow-up for prostate cancer stage II now out 2-1/2 years having completed image guided radiation therapy.  REFERRING PROVIDER: No ref. provider found  HPI: Patient is a 82 year old male now out 2-1/2 years having completed radiation therapy to his prostate for Gleason 7 (3+4) adenocarcinoma. He is seen today in routine follow-up and is doing well. His only complaint is constipation which has been a chronic complaint for many years he specifically denies any exacerbation of lower urinary tract symptoms such as dysuria frequency or urgency.. His last PSA was back in January 2016 was 0.9. PSA was obtained today  COMPLICATIONS OF TREATMENT: none  FOLLOW UP COMPLIANCE: keeps appointments   PHYSICAL EXAM:  BP 135/69 mmHg  Pulse 51  Temp(Src) 97.1 F (36.2 C)  Wt 123 lb 7.3 oz (56 kg) On rectal exam rectal sphincter tone is good. Prostate is smooth contracted without evidence of nodularity or mass. Sulcus is preserved bilaterally. No discrete nodularity is identified. No other rectal abnormalities are noted. Well-developed well-nourished patient in NAD. HEENT reveals PERLA, EOMI, discs not visualized.  Oral cavity is clear. No oral mucosal lesions are identified. Neck is clear without evidence of cervical or supraclavicular adenopathy. Lungs are clear to A&P. Cardiac examination is essentially unremarkable with regular rate and rhythm without murmur rub or thrill. Abdomen is benign with no organomegaly or masses noted. Motor sensory and DTR levels are equal and symmetric in the upper and lower extremities. Cranial nerves II through XII are grossly intact. Proprioception is intact. No peripheral adenopathy or edema is identified. No motor or sensory levels are noted. Crude visual fields are within normal range.  RADIOLOGY RESULTS: No  current films for review  PLAN: Present time he is doing well. I obtained a PSA level today and will report that separately. Otherwise I'm please was overall progress now 2 and half years out. He continues to take mild laxatives for his chronic constipation. I have asked to see him back in 1 year for follow-up. He knows to call sooner with any concerns.  I would like to take this opportunity for allowing me to participate in the care of your patient.[name]., MD

## 2015-11-03 IMAGING — CT CT ABD-PEL WO/W CM
2 of 10 series · 10 of 46 positions shown, 16 images · IV contrast (omnipaque)
Comparison: 04/03/2015

CLINICAL DATA: Patient reports gross hematuria which began 1 month
ago.

EXAM:
CT ABDOMEN AND PELVIS WITHOUT AND WITH CONTRAST
TECHNIQUE: Multidetector CT imaging of the abdomen and pelvis was performed
following the standard protocol before and following the bolus
administration of intravenous contrast.
CONTRAST:  100mL OMNIPAQUE IOHEXOL 300 MG/ML  SOLN

[Series 2: soft tissue · axial · 0.72mm/px · z∈[-28,+318]mm · 8 of 89 slices shown, 13 images]
[im 10/89  soft-tissue]
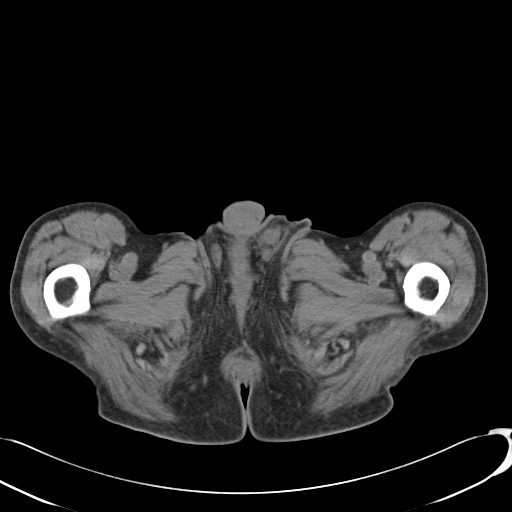
[im 10/89  bone]
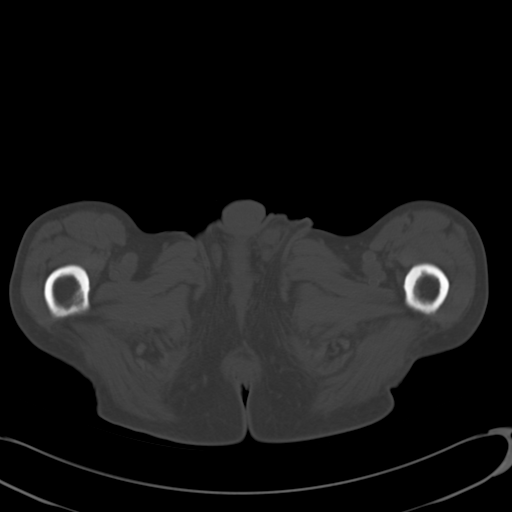
[im 20/89  soft-tissue]
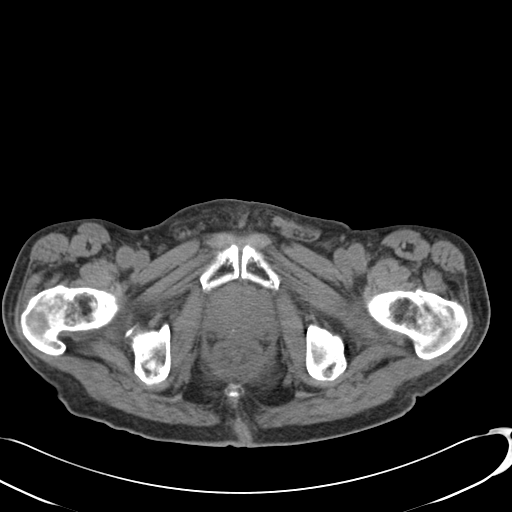
[im 30/89  soft-tissue]
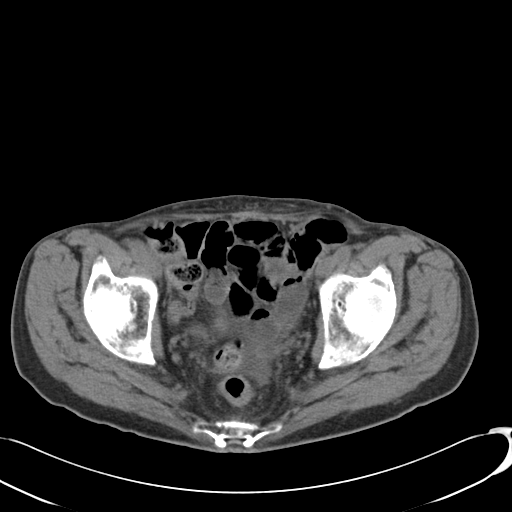
[im 40/89  soft-tissue]
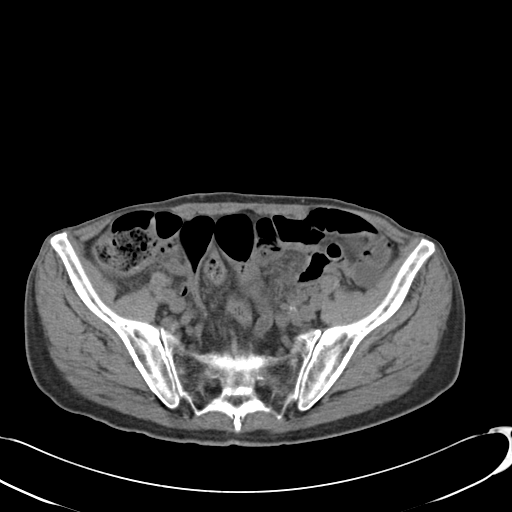
[im 49/89  soft-tissue]
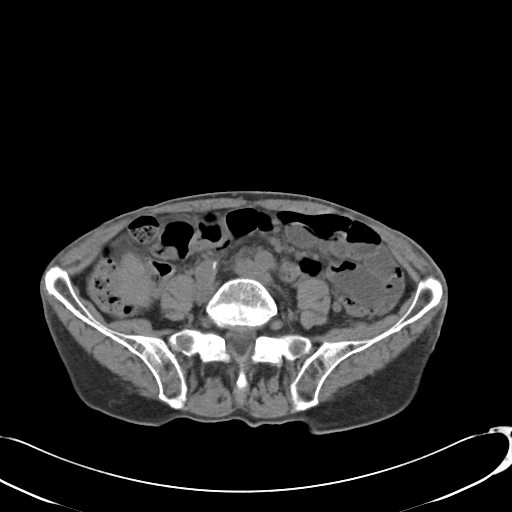
[im 49/89  lung]
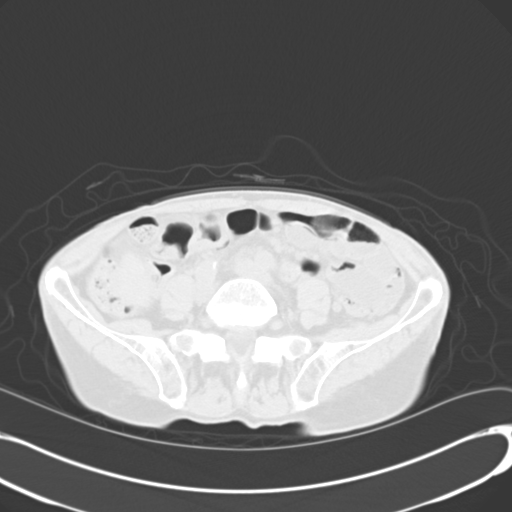
[im 59/89  soft-tissue]
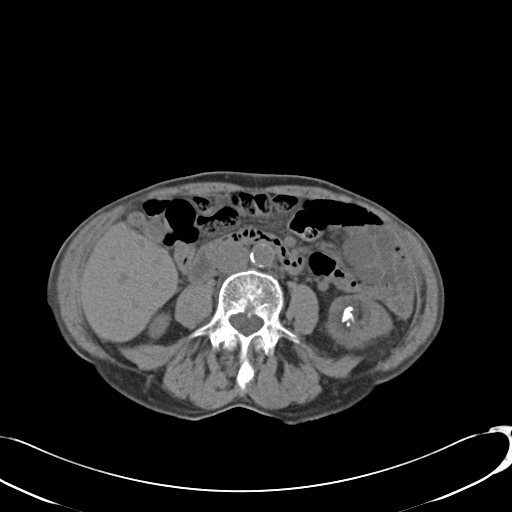
[im 59/89  lung]
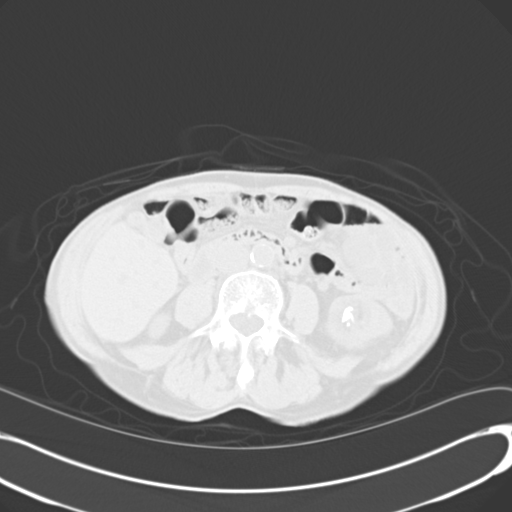
[im 69/89  soft-tissue]
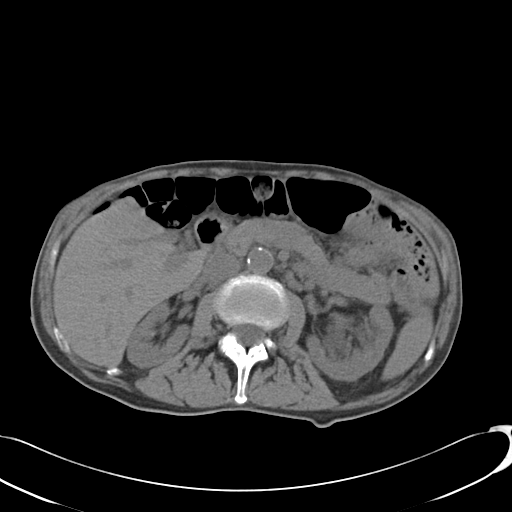
[im 69/89  lung]
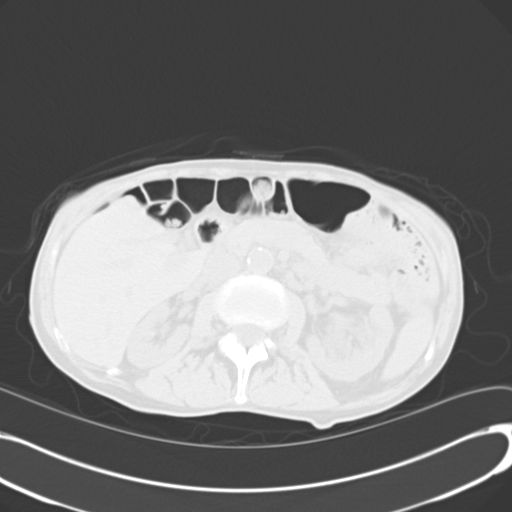
[im 79/89  soft-tissue]
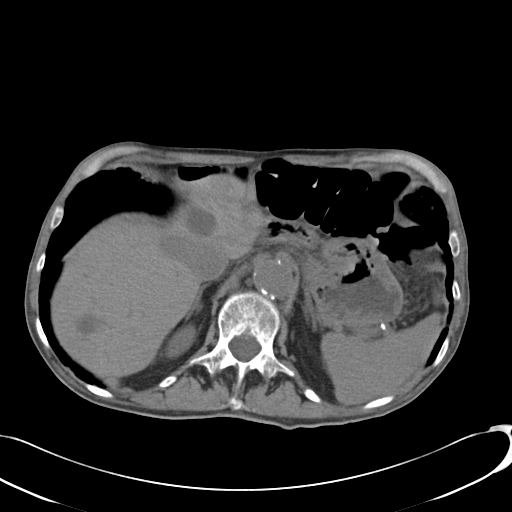
[im 79/89  lung]
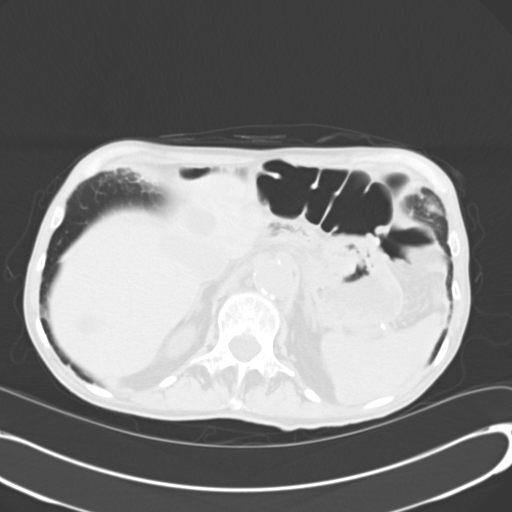

[Series 5: coronal · coronal · 0.74mm/px · 2 of 112 slices shown, 3 images]
[im 38/112  soft-tissue]
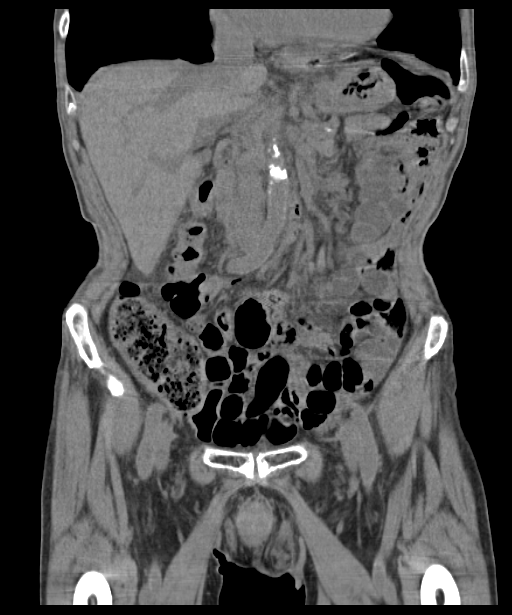
[im 38/112  bone]
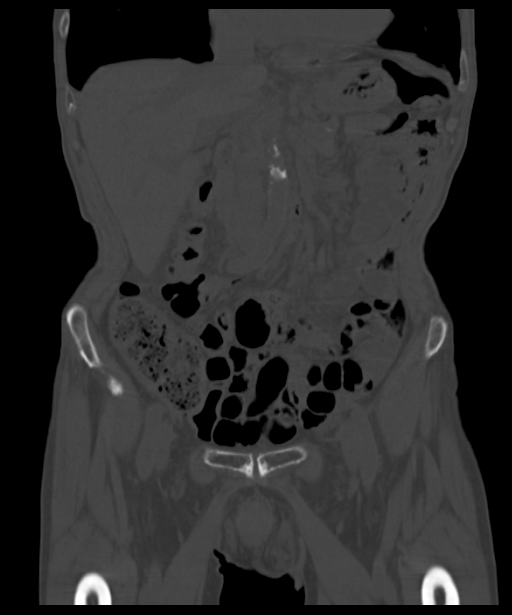
[im 75/112  soft-tissue]
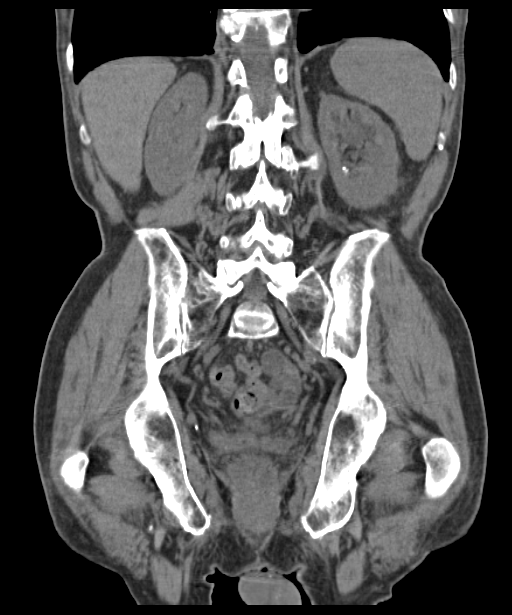

[10 of 46 positions shown; findings below may reference images not displayed]

FINDINGS: Lower chest: There is no pleural effusion identified. Scar like
densities noted in the left lower lobe.

Hepatobiliary: Several liver cysts are again identified and appear
similar to the previous exam. The gallbladder appears normal. There
is no biliary dilatation.

Pancreas: Bilobed low-attenuation structure within the uncinate
process of pancreas measures 2.2 x 1.0 cm and is unchanged compared
with previous exam. This is been stable since [DATE] and is
favored to represent a benign cystic abnormality. New from 0920 is a
9 mm low attenuation structure within the head of pancreas, image
20/series 2. Unchanged from 04/03/2015. The body and tail of
pancreas appear normal.

Spleen: Negative appearance of the spleen.

Adrenals/Urinary Tract: The adrenal glands are both normal. There is
a nonobstructing calculus within the lower pole of the right kidney
which measures 3 mm, image 25 of series 2. Multiple left renal
calculi are noted within the mid and inferior pole collecting
system. The largest stone is in the lower pole measuring 6 mm, image
64/series 5. There are multiple parapelvic cysts within the left
kidney. No obstructive uropathy identified. No ureteral calculi
noted. Marked diffuse an irregular bladder wall thickening is
identified with heterogeneous areas of mucosal enhancement, image 63
of series 9.

Stomach/Bowel: The stomach is within normal limits. The small bowel
loops have a normal course and caliber. No obstruction. Normal
appearance of the colon. A moderate stool burden is noted within the
colon and rectum.

Vascular/Lymphatic: Calcified atherosclerotic disease involves the
abdominal aorta. No aneurysm. No enlarged retroperitoneal or
mesenteric adenopathy. No enlarged pelvic or inguinal lymph nodes.

Reproductive: Prostate gland appears enlarged. Defect within the
central portion of the gland may be postsurgical.

Other: There is a trace amount of free fluid within the pelvis.

Musculoskeletal: No aggressive lytic or sclerotic bone lesions
identified. Scoliosis is noted which is convex towards the left.
IMPRESSION: 1. Diffuse, irregular bladder wall thickening is identified. There
is enlargement of the prostate gland which may cause bladder outlet
obstruction.
2. Bilateral nonobstructing renal calculi.
3. Left-sided renal sinus cysts.
4. Aortic atherosclerosis.
5. Low-attenuation foci within the head of pancreas are unchanged
from previous exam. Favor a benign etiology. Recommend followup
imaging in 12 months. At this time the study of choice would be a
pancreas protocol MRI.

## 2016-09-01 ENCOUNTER — Other Ambulatory Visit: Payer: Self-pay | Admitting: *Deleted

## 2016-09-01 DIAGNOSIS — C61 Malignant neoplasm of prostate: Secondary | ICD-10-CM

## 2016-09-05 ENCOUNTER — Ambulatory Visit: Payer: Medicare Other | Attending: Radiation Oncology | Admitting: Radiation Oncology

## 2016-09-05 ENCOUNTER — Inpatient Hospital Stay: Payer: Medicare Other | Attending: Radiation Oncology

## 2018-07-08 DEATH — deceased
